# Patient Record
Sex: Female | Born: 1995 | Race: White | Hispanic: No | Marital: Married | Smoking: Never smoker
Health system: Southern US, Community
[De-identification: ages and names within clinical notes are randomized; demographics above are authoritative.]

## PROBLEM LIST (undated history)

## (undated) ENCOUNTER — Inpatient Hospital Stay (HOSPITAL_COMMUNITY): Payer: Self-pay

## (undated) DIAGNOSIS — F419 Anxiety disorder, unspecified: Secondary | ICD-10-CM

## (undated) DIAGNOSIS — F329 Major depressive disorder, single episode, unspecified: Secondary | ICD-10-CM

## (undated) DIAGNOSIS — Z6841 Body Mass Index (BMI) 40.0 and over, adult: Secondary | ICD-10-CM

## (undated) DIAGNOSIS — G43909 Migraine, unspecified, not intractable, without status migrainosus: Secondary | ICD-10-CM

## (undated) DIAGNOSIS — F32A Depression, unspecified: Secondary | ICD-10-CM

## (undated) HISTORY — PX: TONSILLECTOMY: SUR1361

## (undated) HISTORY — DX: Migraine, unspecified, not intractable, without status migrainosus: G43.909

---
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
## 2017-08-13 LAB — OB RESULTS CONSOLE RPR: RPR: NONREACTIVE

## 2017-08-13 LAB — OB RESULTS CONSOLE HEPATITIS B SURFACE ANTIGEN: Hepatitis B Surface Ag: NEGATIVE

## 2017-08-13 LAB — OB RESULTS CONSOLE GC/CHLAMYDIA
Chlamydia: NEGATIVE
Gonorrhea: NEGATIVE

## 2017-08-13 LAB — OB RESULTS CONSOLE ABO/RH: RH Type: POSITIVE

## 2017-08-13 LAB — OB RESULTS CONSOLE HIV ANTIBODY (ROUTINE TESTING): HIV: NONREACTIVE

## 2017-08-13 LAB — OB RESULTS CONSOLE RUBELLA ANTIBODY, IGM: Rubella: NON-IMMUNE/NOT IMMUNE

## 2017-08-13 LAB — OB RESULTS CONSOLE ANTIBODY SCREEN: Antibody Screen: NEGATIVE

## 2017-09-08 NOTE — L&D Delivery Note (Signed)
Delivery Note At 0345 a viable female was delivered via spontaneous vaginal delivery (Presentation: OA ).  APGAR: 7, 9; weight pending.   Placenta status: spontaneously and completely delivered.  Cord: three vessel with the following complications: nuchal x1   Anesthesia:  Epidural Episiotomy:  None Lacerations:  Second degree Suture Repair: 3.0 chromic vicryl rapide Est. Blood Loss (mL):  250  Mom to postpartum.  Baby to Couplet care / Skin to Skin.  Megan Morris 02/19/2018, 5:03 AM

## 2017-12-01 ENCOUNTER — Inpatient Hospital Stay (HOSPITAL_COMMUNITY)
Admission: AD | Admit: 2017-12-01 | Discharge: 2017-12-01 | Disposition: A | Discharge status: Home / Self Care | Payer: 59 | Source: Ambulatory Visit | Attending: Obstetrics and Gynecology | Admitting: Obstetrics and Gynecology

## 2017-12-01 ENCOUNTER — Encounter (HOSPITAL_COMMUNITY): Payer: Self-pay

## 2017-12-01 ENCOUNTER — Inpatient Hospital Stay (HOSPITAL_COMMUNITY): Payer: 59

## 2017-12-01 DIAGNOSIS — Z3A28 28 weeks gestation of pregnancy: Secondary | ICD-10-CM | POA: Diagnosis not present

## 2017-12-01 DIAGNOSIS — O368130 Decreased fetal movements, third trimester, not applicable or unspecified: Secondary | ICD-10-CM | POA: Insufficient documentation

## 2017-12-01 DIAGNOSIS — O368390 Maternal care for abnormalities of the fetal heart rate or rhythm, unspecified trimester, not applicable or unspecified: Secondary | ICD-10-CM

## 2017-12-01 DIAGNOSIS — Z885 Allergy status to narcotic agent status: Secondary | ICD-10-CM | POA: Insufficient documentation

## 2017-12-01 LAB — URINALYSIS, ROUTINE W REFLEX MICROSCOPIC
Bilirubin Urine: NEGATIVE
Glucose, UA: NEGATIVE mg/dL
Hgb urine dipstick: NEGATIVE
Ketones, ur: NEGATIVE mg/dL
Leukocytes, UA: NEGATIVE
Nitrite: NEGATIVE
Protein, ur: NEGATIVE mg/dL
Specific Gravity, Urine: 1.015 (ref 1.005–1.030)
pH: 6 (ref 5.0–8.0)

## 2017-12-01 NOTE — MAU Note (Signed)
Pt presents to MAU with c/o decreased fetal movement for the last few days. Pt denies VB and LOF.

## 2017-12-01 NOTE — MAU Provider Note (Addendum)
  History     CSN: [id number]  Arrival date and time: 12/01/17 1646   None     Chief Complaint  Patient presents with  . Decreased Fetal Movement   HPI Pt seen in the office today by Dr. Drema BalzarineKulwas and reported decreased fetal movement and [facility] and if persistent, report to MAU for eval for NST and BPP.  RN called and reported the pt had arrived and upon trying to do external fetal monitoring noted fetal movement with difficulty tracing the baby.  OB History    Gravida  1   Para      Term      Preterm      AB      Living        SAB      TAB      Ectopic      Multiple      Live Births              History reviewed. No pertinent past medical history.  Past Surgical History:  Procedure Laterality Date  . TONSILLECTOMY      Family History  Problem Relation Age of Onset  . Diabetes Mother   . Fibromyalgia Mother   . Diabetes Father     Social History   Tobacco Use  . Smoking status: Never Smoker  . Smokeless tobacco: Never Used  Substance Use Topics  . Alcohol use: Never    Frequency: Never  . Drug use: Never    Allergies:  Allergies  Allergen Reactions  . Hydrocodone Itching    Medications Prior to Admission  Medication Sig Dispense Refill Last Dose  . Prenatal Vit-Fe Fumarate-FA (PRENATAL MULTIVITAMIN) TABS tablet Take 1 tablet by mouth daily at 12 noon.   11/30/2017 at Unknown time  . promethazine (PHENERGAN) 25 MG tablet Take 25 mg by mouth every 6 (six) hours as needed for nausea or vomiting.   11/30/2017 at Unknown time    Review of Systems  Denies F/C/N/V/D  Physical Exam   Weight 287 lb (130.2 kg). BP in the office 110/58  Physical Exam (per Illene BolusLori Morris, CNM afer 7p sign out) Lungs CTA CV RRR Abd gravid, NT Ext no calf tenderness FHT 130, mod variability, +accel, no decels Toco none  MAU Course  Procedures BPP 8/8 NST CAT 1  Assessment and Plan  P0 at 28 4/7wks with decreased  FM sent to MAU for NST and BPP.  Discharge to home  RTO at regular scheduled appointment  Megan Morris 12/01/2017, 6:24 PM   Illene BolusLori Morris CNM 04/29/18 823pm

## 2017-12-17 ENCOUNTER — Other Ambulatory Visit (HOSPITAL_COMMUNITY): Payer: Self-pay | Admitting: Obstetrics and Gynecology

## 2017-12-17 DIAGNOSIS — O26849 Uterine size-date discrepancy, unspecified trimester: Secondary | ICD-10-CM

## 2018-01-05 ENCOUNTER — Encounter (HOSPITAL_COMMUNITY): Payer: Self-pay

## 2018-01-06 ENCOUNTER — Ambulatory Visit (HOSPITAL_COMMUNITY)
Admission: RE | Admit: 2018-01-06 | Discharge: 2018-01-06 | Disposition: A | Discharge status: Home / Self Care | Payer: 59 | Source: Ambulatory Visit | Attending: Obstetrics and Gynecology | Admitting: Obstetrics and Gynecology

## 2018-01-06 ENCOUNTER — Encounter (HOSPITAL_COMMUNITY): Payer: Self-pay

## 2018-01-06 DIAGNOSIS — O99213 Obesity complicating pregnancy, third trimester: Secondary | ICD-10-CM | POA: Diagnosis present

## 2018-01-06 DIAGNOSIS — O26843 Uterine size-date discrepancy, third trimester: Secondary | ICD-10-CM | POA: Insufficient documentation

## 2018-01-06 DIAGNOSIS — O26849 Uterine size-date discrepancy, unspecified trimester: Secondary | ICD-10-CM

## 2018-01-06 DIAGNOSIS — Z3A33 33 weeks gestation of pregnancy: Secondary | ICD-10-CM | POA: Diagnosis not present

## 2018-01-06 HISTORY — DX: Anxiety disorder, unspecified: F41.9

## 2018-01-06 HISTORY — DX: Morbid (severe) obesity due to excess calories: E66.01

## 2018-01-06 HISTORY — DX: Body Mass Index (BMI) 40.0 and over, adult: Z684

## 2018-01-06 HISTORY — DX: Major depressive disorder, single episode, unspecified: F32.9

## 2018-01-06 HISTORY — DX: Depression, unspecified: F32.A

## 2018-01-06 NOTE — Addendum Note (Signed)
Encounter addended by: Lenoard Aden, RDMS on: 01/06/2018 4:35 PM  Actions taken: Imaging Exam ended

## 2018-01-07 ENCOUNTER — Other Ambulatory Visit (HOSPITAL_COMMUNITY): Payer: Self-pay | Admitting: *Deleted

## 2018-01-07 DIAGNOSIS — O3663X0 Maternal care for excessive fetal growth, third trimester, not applicable or unspecified: Secondary | ICD-10-CM

## 2018-01-15 LAB — OB RESULTS CONSOLE GBS: GBS: NEGATIVE

## 2018-02-03 ENCOUNTER — Encounter (HOSPITAL_COMMUNITY): Payer: Self-pay

## 2018-02-03 ENCOUNTER — Ambulatory Visit (HOSPITAL_COMMUNITY)
Admission: RE | Admit: 2018-02-03 | Discharge: 2018-02-03 | Disposition: A | Discharge status: Home / Self Care | Payer: 59 | Source: Ambulatory Visit | Attending: Obstetrics and Gynecology | Admitting: Obstetrics and Gynecology

## 2018-02-03 ENCOUNTER — Other Ambulatory Visit (HOSPITAL_COMMUNITY): Payer: Self-pay | Admitting: Obstetrics and Gynecology

## 2018-02-03 DIAGNOSIS — O99213 Obesity complicating pregnancy, third trimester: Secondary | ICD-10-CM | POA: Insufficient documentation

## 2018-02-03 DIAGNOSIS — Z3A37 37 weeks gestation of pregnancy: Secondary | ICD-10-CM | POA: Diagnosis not present

## 2018-02-03 DIAGNOSIS — Z362 Encounter for other antenatal screening follow-up: Secondary | ICD-10-CM

## 2018-02-03 DIAGNOSIS — O3663X0 Maternal care for excessive fetal growth, third trimester, not applicable or unspecified: Secondary | ICD-10-CM | POA: Diagnosis not present

## 2018-02-03 DIAGNOSIS — O26843 Uterine size-date discrepancy, third trimester: Secondary | ICD-10-CM

## 2018-02-15 ENCOUNTER — Encounter (HOSPITAL_COMMUNITY): Payer: Self-pay | Admitting: *Deleted

## 2018-02-15 ENCOUNTER — Telehealth (HOSPITAL_COMMUNITY): Payer: Self-pay | Admitting: *Deleted

## 2018-02-15 NOTE — Telephone Encounter (Signed)
Preadmission screen  

## 2018-02-17 ENCOUNTER — Other Ambulatory Visit: Payer: Self-pay | Admitting: Obstetrics & Gynecology

## 2018-02-18 ENCOUNTER — Other Ambulatory Visit: Payer: Self-pay

## 2018-02-18 ENCOUNTER — Inpatient Hospital Stay (HOSPITAL_COMMUNITY): Payer: 59 | Admitting: Anesthesiology

## 2018-02-18 ENCOUNTER — Encounter (HOSPITAL_COMMUNITY): Payer: Self-pay

## 2018-02-18 ENCOUNTER — Encounter (HOSPITAL_COMMUNITY): Payer: Self-pay | Admitting: *Deleted

## 2018-02-18 ENCOUNTER — Inpatient Hospital Stay (HOSPITAL_COMMUNITY)
Admission: AD | Admit: 2018-02-18 | Discharge: 2018-02-21 | DRG: 807 | Disposition: A | Discharge status: Home / Self Care | Payer: 59 | Attending: Obstetrics & Gynecology | Admitting: Obstetrics & Gynecology

## 2018-02-18 DIAGNOSIS — Z3A39 39 weeks gestation of pregnancy: Secondary | ICD-10-CM | POA: Diagnosis not present

## 2018-02-18 DIAGNOSIS — F419 Anxiety disorder, unspecified: Secondary | ICD-10-CM | POA: Diagnosis present

## 2018-02-18 DIAGNOSIS — O3663X0 Maternal care for excessive fetal growth, third trimester, not applicable or unspecified: Secondary | ICD-10-CM | POA: Diagnosis present

## 2018-02-18 DIAGNOSIS — O99214 Obesity complicating childbirth: Principal | ICD-10-CM | POA: Diagnosis present

## 2018-02-18 DIAGNOSIS — F329 Major depressive disorder, single episode, unspecified: Secondary | ICD-10-CM | POA: Diagnosis present

## 2018-02-18 DIAGNOSIS — O99344 Other mental disorders complicating childbirth: Secondary | ICD-10-CM | POA: Diagnosis present

## 2018-02-18 DIAGNOSIS — O99213 Obesity complicating pregnancy, third trimester: Secondary | ICD-10-CM | POA: Diagnosis present

## 2018-02-18 LAB — CBC
HCT: 33.5 % — ABNORMAL LOW (ref 36.0–46.0)
Hemoglobin: 11.1 g/dL — ABNORMAL LOW (ref 12.0–15.0)
MCH: 26.3 pg (ref 26.0–34.0)
MCHC: 33.1 g/dL (ref 30.0–36.0)
MCV: 79.4 fL (ref 78.0–100.0)
Platelets: 179 10*3/uL (ref 150–400)
RBC: 4.22 MIL/uL (ref 3.87–5.11)
RDW: 14.2 % (ref 11.5–15.5)
WBC: 11.1 10*3/uL — ABNORMAL HIGH (ref 4.0–10.5)

## 2018-02-18 LAB — TYPE AND SCREEN
ABO/RH(D): A POS
Antibody Screen: NEGATIVE

## 2018-02-18 LAB — RPR: RPR Ser Ql: NONREACTIVE

## 2018-02-18 LAB — ABO/RH: ABO/RH(D): A POS

## 2018-02-18 MED ORDER — FENTANYL CITRATE (PF) 100 MCG/2ML IJ SOLN: 50.0000 ug | INTRAMUSCULAR | Status: DC | PRN

## 2018-02-18 MED ORDER — SOD CITRATE-CITRIC ACID 500-334 MG/5ML PO SOLN
30.0000 mL | ORAL | Status: DC | PRN
  Administered 2018-02-18: 30 mL via ORAL
  Filled 2018-02-18: qty 15

## 2018-02-18 MED ORDER — PHENYLEPHRINE 40 MCG/ML (10ML) SYRINGE FOR IV PUSH (FOR BLOOD PRESSURE SUPPORT): 80.0000 ug | PREFILLED_SYRINGE | INTRAVENOUS | Status: DC | PRN

## 2018-02-18 MED ORDER — PHENYLEPHRINE 40 MCG/ML (10ML) SYRINGE FOR IV PUSH (FOR BLOOD PRESSURE SUPPORT)
80.0000 ug | PREFILLED_SYRINGE | INTRAVENOUS | Status: DC | PRN
  Filled 2018-02-18: qty 5
  Filled 2018-02-18: qty 10

## 2018-02-18 MED ORDER — LACTATED RINGERS IV SOLN: 500.0000 mL | Freq: Once | INTRAVENOUS | Status: DC

## 2018-02-18 MED ORDER — LIDOCAINE HCL (PF) 1 % IJ SOLN
INTRAMUSCULAR | Status: DC | PRN
  Administered 2018-02-18 (×2): 5 mL via EPIDURAL

## 2018-02-18 MED ORDER — LIDOCAINE HCL (PF) 1 % IJ SOLN: 30.0000 mL | INTRAMUSCULAR | Status: DC | PRN

## 2018-02-18 MED ORDER — OXYTOCIN 40 UNITS IN LACTATED RINGERS INFUSION - SIMPLE MED: 1.0000 m[IU]/min | INTRAVENOUS | Status: DC

## 2018-02-18 MED ORDER — ACETAMINOPHEN 325 MG PO TABS: 650.0000 mg | ORAL_TABLET | ORAL | Status: DC | PRN

## 2018-02-18 MED ORDER — LACTATED RINGERS IV SOLN
500.0000 mL | Freq: Once | INTRAVENOUS | Status: AC
  Administered 2018-02-18: 500 mL via INTRAVENOUS

## 2018-02-18 MED ORDER — LIDOCAINE HCL (PF) 1 % IJ SOLN
30.0000 mL | INTRAMUSCULAR | Status: DC | PRN
  Administered 2018-02-19: 30 mL via SUBCUTANEOUS
  Filled 2018-02-18: qty 30

## 2018-02-18 MED ORDER — OXYTOCIN 40 UNITS IN LACTATED RINGERS INFUSION - SIMPLE MED
1.0000 m[IU]/min | INTRAVENOUS | Status: DC
  Administered 2018-02-18: 2 m[IU]/min via INTRAVENOUS

## 2018-02-18 MED ORDER — FLEET ENEMA 7-19 GM/118ML RE ENEM
1.0000 | ENEMA | RECTAL | Status: DC | PRN
Start: 2018-02-18 — End: 2018-02-20

## 2018-02-18 MED ORDER — TERBUTALINE SULFATE 1 MG/ML IJ SOLN
0.2500 mg | Freq: Once | INTRAMUSCULAR | Status: AC | PRN
  Administered 2018-02-18: 0.25 mg via SUBCUTANEOUS
  Filled 2018-02-18: qty 1

## 2018-02-18 MED ORDER — OXYTOCIN 40 UNITS IN LACTATED RINGERS INFUSION - SIMPLE MED: 2.5000 [IU]/h | INTRAVENOUS | Status: DC

## 2018-02-18 MED ORDER — ONDANSETRON HCL 4 MG/2ML IJ SOLN: 4.0000 mg | Freq: Four times a day (QID) | INTRAMUSCULAR | Status: DC | PRN

## 2018-02-18 MED ORDER — LACTATED RINGERS IV SOLN: 500.0000 mL | INTRAVENOUS | Status: DC | PRN

## 2018-02-18 MED ORDER — EPHEDRINE 5 MG/ML INJ: 10.0000 mg | INTRAVENOUS | Status: DC | PRN

## 2018-02-18 MED ORDER — EPHEDRINE 5 MG/ML INJ
10.0000 mg | INTRAVENOUS | Status: DC | PRN
  Filled 2018-02-18: qty 2

## 2018-02-18 MED ORDER — OXYTOCIN 40 UNITS IN LACTATED RINGERS INFUSION - SIMPLE MED
2.5000 [IU]/h | INTRAVENOUS | Status: DC
  Administered 2018-02-19: 2.5 [IU]/h via INTRAVENOUS

## 2018-02-18 MED ORDER — PHENYLEPHRINE 40 MCG/ML (10ML) SYRINGE FOR IV PUSH (FOR BLOOD PRESSURE SUPPORT)
80.0000 ug | PREFILLED_SYRINGE | INTRAVENOUS | Status: DC | PRN
Start: 2018-02-18 — End: 2018-02-20
  Filled 2018-02-18: qty 5

## 2018-02-18 MED ORDER — ACETAMINOPHEN 325 MG PO TABS
650.0000 mg | ORAL_TABLET | ORAL | Status: DC | PRN
  Administered 2018-02-19: 650 mg via ORAL
  Filled 2018-02-18 (×4): qty 2

## 2018-02-18 MED ORDER — OXYTOCIN 40 UNITS IN LACTATED RINGERS INFUSION - SIMPLE MED
1.0000 m[IU]/min | INTRAVENOUS | Status: DC
  Filled 2018-02-18: qty 1000

## 2018-02-18 MED ORDER — DIPHENHYDRAMINE HCL 50 MG/ML IJ SOLN: 12.5000 mg | INTRAMUSCULAR | Status: DC | PRN

## 2018-02-18 MED ORDER — LACTATED RINGERS IV SOLN
INTRAVENOUS | Status: DC
  Administered 2018-02-19: via INTRAVENOUS

## 2018-02-18 MED ORDER — OXYTOCIN BOLUS FROM INFUSION
500.0000 mL | Freq: Once | INTRAVENOUS | Status: AC
  Administered 2018-02-19: 500 mL via INTRAVENOUS

## 2018-02-18 MED ORDER — OXYTOCIN BOLUS FROM INFUSION: 500.0000 mL | Freq: Once | INTRAVENOUS | Status: DC

## 2018-02-18 MED ORDER — LACTATED RINGERS IV SOLN
INTRAVENOUS | Status: DC
  Administered 2018-02-18 (×3): via INTRAVENOUS

## 2018-02-18 MED ORDER — MISOPROSTOL 25 MCG QUARTER TABLET
25.0000 ug | ORAL_TABLET | ORAL | Status: DC | PRN
  Filled 2018-02-18: qty 1

## 2018-02-18 MED ORDER — LACTATED RINGERS IV SOLN
INTRAVENOUS | Status: DC
  Administered 2018-02-18 – 2018-02-19 (×2): via INTRAUTERINE

## 2018-02-18 MED ORDER — SOD CITRATE-CITRIC ACID 500-334 MG/5ML PO SOLN: 30.0000 mL | ORAL | Status: DC | PRN

## 2018-02-18 MED ORDER — FENTANYL 2.5 MCG/ML BUPIVACAINE 1/10 % EPIDURAL INFUSION (WH - ANES)
14.0000 mL/h | INTRAMUSCULAR | Status: DC | PRN
  Administered 2018-02-18 – 2018-02-19 (×3): 14 mL/h via EPIDURAL
  Filled 2018-02-18 (×3): qty 100

## 2018-02-18 NOTE — Progress Notes (Signed)
Faculty Practice OB/GYN Attending Note  Subjective:  Called to evaluate patient around 2010 with deep recurrent variable FHR decelerations. Upon my arrival, I noted that patient was undergoing re-positioning and other intrauterine neonatal resuscitation maneuvers. Pitocin has been turned off; amnioinfusion ongoing.   Contractions were occurring every 2-3 minutes, FHR with baseline of 130 bpm with deep variable decelerations going down to 60s lasting 1- 1.5 minutes.  Recent cervical exam was 6/90/-1.  I ordered for terbutaline to be given; one dose was administered.  After this and further position changes, the FHR decelerations were noted to resolve to a Category 1 FHR tracing.  Contractions are now every 4 minutes.  No vaginal bleeding. Good FM.  Will continue to monitor closely for now. Hoping for vaginal delivery. I will continue to stand by as needed for emergency issues.  Dr. Mora ApplPinn (covering MD who is currently in OR doing an emergent procedure) is aware of this situation).   Megan CollinsUGONNA  [name], MD, FACOG Obstetrician & Gynecologist, [facility], [facility]

## 2018-02-18 NOTE — Anesthesia Preprocedure Evaluation (Addendum)
Anesthesia Evaluation  Patient identified by MRN, date of birth, ID band Patient awake    Reviewed: Allergy & Precautions, NPO status , Patient's Chart, lab work & pertinent test results  History of Anesthesia Complications Negative for: history of anesthetic complications  Airway Mallampati: II  TM Distance: >3 FB Neck ROM: Full    Dental  (+) Dental Advisory Given   Pulmonary neg pulmonary ROS,    breath sounds clear to auscultation       Cardiovascular negative cardio ROS   Rhythm:Regular Rate:Normal     Neuro/Psych Anxiety Depression negative neurological ROS     GI/Hepatic Neg liver ROS, GERD  ,  Endo/Other  Morbid obesity  Renal/GU negative Renal ROS     Musculoskeletal   Abdominal (+) + obese,   Peds  Hematology plt 179k   Anesthesia Other Findings   Reproductive/Obstetrics (+) Pregnancy                            Anesthesia Physical Anesthesia Plan  ASA: III  Anesthesia Plan: Epidural   Post-op Pain Management:    Induction:   PONV Risk Score and Plan: 2 and Treatment may vary due to age or medical condition  Airway Management Planned: Natural Airway  Additional Equipment:   Intra-op Plan:   Post-operative Plan:   Informed Consent: I have reviewed the patients History and Physical, chart, labs and discussed the procedure including the risks, benefits and alternatives for the proposed anesthesia with the patient or authorized representative who has indicated his/her understanding and acceptance.   Dental advisory given  Plan Discussed with:   Anesthesia Plan Comments: (Patient identified. Risks/Benefits/Options discussed with patient including but not limited to bleeding, infection, nerve damage, paralysis, failed block, incomplete pain control, headache, blood pressure changes, nausea, vomiting, reactions to medication both or allergic, itching and postpartum back  pain. Confirmed with bedside nurse the patient's most recent platelet count. Confirmed with patient that they are not currently taking any anticoagulation, have any bleeding history or any family history of bleeding disorders. Patient expressed understanding and wished to proceed. All questions were answered. )        Anesthesia Quick Evaluation

## 2018-02-18 NOTE — Progress Notes (Signed)
LABOR PROGRESS NOTE  Megan ButcherHarley Morris is a 22 y.o. G1P0 at 1526w6d  admitted for SROM @ 515 am clear fluid.  Subjective: Comfortable with epidural. Occasional variable decel  Objective: BP 108/70   Pulse 83   Temp 98.5 F (36.9 C) (Oral)   Resp 18   Ht 5\' 8"  (1.727 m)   Wt 298 lb (135.2 kg)   LMP 04/23/2017 (Exact Date)   SpO2 98%   BMI 45.31 kg/m  or  Vitals:   02/18/18 1155 02/18/18 1200 02/18/18 1201 02/18/18 1233  BP:   122/70 108/70  Pulse:   85 83  Resp:   18   Temp:      TempSrc:      SpO2: 99% 98%    Weight:      Height:         Dilation: 3 Effacement (%): 90 Cervical Position: Anterior Station: -2 Presentation: Vertex Exam by:: L. Clemmons CNM  Labs: Lab Results  Component Value Date   WBC 11.1 (H) 02/18/2018   HGB 11.1 (L) 02/18/2018   HCT 33.5 (L) 02/18/2018   MCV 79.4 02/18/2018   PLT 179 02/18/2018    Patient Active Problem List   Diagnosis Date Noted  . Normal labor 02/18/2018  . Obesity affecting pregnancy in third trimester 02/18/2018    Assessment / Plan: 22 y.o. G1P0 at 4126w6d here for SROM/augmentation of labor  IUPC placed without difficulty Pitocin on 12 cc/hr FSE in place Fetal Wellbeing:  Cat 1 Pain Control:  Epidural Anticipated MOD:  Increased risk for C/S due to baby's size.  Lori A Clemmons CNM 02/18/2018, 1:01 PM

## 2018-02-18 NOTE — Progress Notes (Signed)
Megan Morris is a 22 y.o. G1P0 at 6953w6d  admitted for SROM @ 515 am clear fluid.  Subjective: Comfortable with epidural. Called to bedside at 1930 as patient was having recurrent variable decels into 60s and 70s. Nurses had repositioned patient right lateral, tried left lateral, and hands and knees before I was urgently called to attend a delivery. Consulted Dr. Mora ApplPinn who was in surgery, requested Dr. Macon LargeAnyanwu at bedside to assess patient. She repositioned patient to high fowlers, discontinued pitocin, and gave dose of terbutaline.   Objective:  Vitals:   02/18/18 1802 02/18/18 1831 02/18/18 1901 02/18/18 1930  BP: (!) 101/58 (!) 105/51 115/68 116/62  Pulse: (!) 119 82 81 81  Resp: 16  18 16   Temp: 98.3 F (36.8 C)     TempSrc: Axillary     SpO2:      Weight:      Height:       Vaginal exam: 8/90/-1/vtx   FHTs: baseline 145, moderate beat to beat variability, +accels, - decels   Labs: Lab Results  Component Value Date   WBC 11.1 (H) 02/18/2018   HGB 11.1 (L) 02/18/2018   HCT 33.5 (L) 02/18/2018   MCV 79.4 02/18/2018   PLT 179 02/18/2018    Patient Active Problem List   Diagnosis Date Noted  . Normal labor 02/18/2018  . Obesity affecting pregnancy in third trimester 02/18/2018    Assessment / Plan: 22 y.o. G1P0 at 4653w6d here for SROM/augmentation of labor  Pitocin off  FSE and IUPC in place Fetal Wellbeing:  Cat II now category I  Pain Control:  Epidural Anticipated MOD:  Anticipate NSVD  Megan Morris CNM 02/18/2018, 8:51 PM

## 2018-02-18 NOTE — H&P (Addendum)
Megan Morris is a 22 y.o. female presenting for induction of labor due to Obesity.Pregnancy complicated by large for gestational age fetus.[place] on 02/11/18 >90% 8-12 oz 3972 grams. , Rubella equivocal  OB History    Gravida  1   Para      Term      Preterm      AB      Living        SAB      TAB      Ectopic      Multiple      Live Births             Past Medical History:  Diagnosis Date  . Anxiety   . Depressive disorder   . Morbid obesity with BMI of 40.0-44.9, adult [name])    Past Surgical History:  Procedure Laterality Date  . TONSILLECTOMY     Family History: family history includes Diabetes in her father and mother; Fibromyalgia in her mother. Social History:  reports that she has never smoked. She has never used smokeless tobacco. She reports that she does not drink alcohol or use drugs. Results for orders placed or performed during the hospital encounter of 02/18/18 (from the past 24 hour(s))  ABO/Rh     Status: None   Collection Time: 02/18/18  7:55 AM  Result Value Ref Range   ABO/RH(D)      A POS Performed at [facility], [address] [place], [place] [address]   CBC     Status: Abnormal   Collection Time: 02/18/18  8:06 AM  Result Value Ref Range   WBC 11.1 (H) 4.0 - 10.5 K/uL   RBC 4.22 3.87 - 5.11 MIL/uL   Hemoglobin 11.1 (L) 12.0 - 15.0 g/dL   HCT 60.4 (L) 54.0 - 98.1 %   MCV 79.4 78.0 - 100.0 fL   MCH 26.3 26.0 - 34.0 pg   MCHC 33.1 30.0 - 36.0 g/dL   RDW 19.1 47.8 - 29.5 %   Platelets 179 150 - 400 K/uL  Type and screen [facility]     Status: None   Collection Time: 02/18/18  8:06 AM  Result Value Ref Range   ABO/RH(D) A POS    Antibody Screen NEG    Sample Expiration      02/21/2018 Performed at [facility], [address], [place], [place] [address]       Maternal Diabetes: No Genetic Screening: Normal Maternal Ultrasounds/Referrals: Abnormal:  Findings:   Other:LGA Fetal Ultrasounds or  other Referrals:  Referred to Materal Fetal Medicine  Maternal Substance Abuse:  No Significant Maternal Medications:  None Significant Maternal Lab Results:  Lab values include: Group B Strep negative Other Comments:  None  Review of Systems  Gastrointestinal: Positive for abdominal pain.  All other systems reviewed and are negative.  Maternal Medical History:  Reason for admission: Rupture of membranes and contractions.   Contractions: Onset was 1-2 hours ago.   Frequency: regular.   Perceived severity is moderate.   Upon arrival  Fetal activity: Perceived fetal activity is normal.   Last perceived fetal movement was within the past hour.    Prenatal complications: LGA OBESITY  Prenatal Complications - Diabetes: none.    Dilation: 3 Effacement (%): 90 Station: -2 Exam by:: L. Jayden Kratochvil CNM Blood pressure 105/62, pulse 90, temperature 98.5 F (36.9 C), temperature source Oral, resp. rate 18, height 5\' 8"  (1.727 m), weight 298 lb (135.2 kg), last menstrual period  04/23/2017, SpO2 98 %. Maternal Exam:  Uterine Assessment: Contraction strength is moderate.  Contraction frequency is regular.   Abdomen: Patient reports no abdominal tenderness. Fundal height is 42.   Estimated fetal weight is 9 pounds.   Fetal presentation: vertex  Introitus: Normal vulva. Normal vagina.  Pelvis: of concern for delivery.   Cervix: Cervix evaluated by digital exam.     Fetal Exam Fetal Monitor Review: Mode: ultrasound.   Pattern: accelerations present and no decelerations.    Fetal State Assessment: Category I - tracings are normal.     Physical Exam  Nursing note and vitals reviewed. Constitutional: She is oriented to person, place, and time. She appears well-developed and well-nourished.  HENT:  Head: Normocephalic and atraumatic.  Cardiovascular: Normal rate and regular rhythm.  Respiratory: Effort normal and breath sounds normal.  GI: Soft. There is no tenderness.   Genitourinary: Vagina normal.  Musculoskeletal: Normal range of motion.  Neurological: She is alert and oriented to person, place, and time.  Skin: Skin is warm and dry.  Psychiatric: She has a normal mood and affect. Her behavior is normal. Thought content normal.    Prenatal labs: ABO, Rh: --/--/A POS (06/13 [id number]) Antibody: NEG (06/13 0806) Rubella: Nonimmune (12/06 0000) RPR: Nonreactive (12/06 0000)  HBsAg: Negative (12/06 0000)  HIV: Non-reactive (12/06 0000)  GBS: Negative (05/10 0000)   Assessment/Plan: Admit to L/D Routine orders Epidural prn Pitocin titration for adequate labor pattern Anticipate vaginal delivery  Theo Reither A Ahsley Attwood CNM 02/18/2018, 1:19 PM

## 2018-02-18 NOTE — MAU Note (Signed)
Pt here with c/o ROM at 0515 for clear fluid. Having contractions as well since then for about every 5 minutes. Denies any bleeding. Good fetal movement.

## 2018-02-18 NOTE — Anesthesia Pain Management Evaluation Note (Signed)
  CRNA Pain Management Visit Note  Patient: Megan Morris, 22 y.o., female  "Hello [facility]. We have an anesthesia team available at all times to provide care throughout the hospital, including epidural management and anesthesia for C-section. I don't know your plan for the delivery whether it a natural birth, water birth, IV sedation, nitrous supplementation, doula or epidural, but we want to meet your pain goals."   1.Was your pain managed to your expectations on prior hospitalizations?   No prior hospitalizations  2.What is your expectation for pain management during this hospitalization?     Epidural  3.How can we help you reach that goal? Maintain comfort with epidural.  Record the patient's initial score and the patient's pain goal.   Pain: 0  Pain Goal: 3 [facility] wants you to be able to say your pain was always managed very well.  Megan Morris 02/18/2018

## 2018-02-18 NOTE — Anesthesia Procedure Notes (Signed)
Epidural Patient location during procedure: OB Start time: 02/18/2018 10:54 AM End time: 02/18/2018 11:14 AM  Staffing Anesthesiologist: Jairo BenJackson, Helyn Schwan, MD Performed: anesthesiologist   Preanesthetic Checklist Completed: patient identified, surgical consent, pre-op evaluation, timeout performed, IV checked, risks and benefits discussed and monitors and equipment checked  Epidural Patient position: sitting Prep: site prepped and draped and DuraPrep Patient monitoring: blood pressure, continuous pulse ox and heart rate Approach: midline Location: L2-L3 Injection technique: LOR air  Needle:  Needle type: Tuohy  Needle gauge: 17 G Needle length: 9 cm Needle insertion depth: 7 cm Catheter type: closed end flexible Catheter size: 19 Gauge Catheter at skin depth: 13 cm Test dose: negative (1% lidocaine)  Assessment Events: blood not aspirated, injection not painful, no injection resistance, negative IV test and no paresthesia  Additional Notes Pt identified in Labor room.  Monitors applied. Working IV access confirmed. Sterile prep, drape lumbar spine.  1% lido local L 2,3.  #17ga Touhy LOR air at 7 cm L 2,3, cath in easily to 13 cm skin. Test dose OK, cath dosed and infusion begun.  Patient asymptomatic, VSS, no heme aspirated, tolerated well.  Megan Morris, MDReason for block:procedure for pain

## 2018-02-19 ENCOUNTER — Encounter (HOSPITAL_COMMUNITY): Payer: Self-pay | Admitting: *Deleted

## 2018-02-19 ENCOUNTER — Inpatient Hospital Stay (HOSPITAL_COMMUNITY)
Admission: RE | Admit: 2018-02-19 | Discharge: 2018-02-19 | Disposition: A | Discharge status: Home / Self Care | Payer: 59 | Source: Ambulatory Visit | Attending: Obstetrics & Gynecology | Admitting: Obstetrics & Gynecology

## 2018-02-19 LAB — CBC
HCT: 31.6 % — ABNORMAL LOW (ref 36.0–46.0)
Hemoglobin: 10.6 g/dL — ABNORMAL LOW (ref 12.0–15.0)
MCH: 26.5 pg (ref 26.0–34.0)
MCHC: 33.5 g/dL (ref 30.0–36.0)
MCV: 79 fL (ref 78.0–100.0)
Platelets: 167 10*3/uL (ref 150–400)
RBC: 4 MIL/uL (ref 3.87–5.11)
RDW: 14.3 % (ref 11.5–15.5)
WBC: 20.5 10*3/uL — ABNORMAL HIGH (ref 4.0–10.5)

## 2018-02-19 MED ORDER — OXYTOCIN 40 UNITS IN LACTATED RINGERS INFUSION - SIMPLE MED: 1.0000 m[IU]/min | INTRAVENOUS | Status: DC

## 2018-02-19 MED ORDER — SENNOSIDES-DOCUSATE SODIUM 8.6-50 MG PO TABS
2.0000 | ORAL_TABLET | ORAL | Status: DC
  Administered 2018-02-19 – 2018-02-21 (×2): 2 via ORAL
  Filled 2018-02-19 (×2): qty 2

## 2018-02-19 MED ORDER — ACETAMINOPHEN 325 MG PO TABS
650.0000 mg | ORAL_TABLET | ORAL | Status: DC | PRN
  Administered 2018-02-19 – 2018-02-21 (×6): 650 mg via ORAL
  Filled 2018-02-19 (×3): qty 2

## 2018-02-19 MED ORDER — ONDANSETRON HCL 4 MG PO TABS: 4.0000 mg | ORAL_TABLET | ORAL | Status: DC | PRN

## 2018-02-19 MED ORDER — TETANUS-DIPHTH-ACELL PERTUSSIS 5-2.5-18.5 LF-MCG/0.5 IM SUSP
0.5000 mL | Freq: Once | INTRAMUSCULAR | Status: DC
  Filled 2018-02-19: qty 0.5

## 2018-02-19 MED ORDER — WITCH HAZEL-GLYCERIN EX PADS: 1.0000 "application " | MEDICATED_PAD | CUTANEOUS | Status: DC | PRN

## 2018-02-19 MED ORDER — PRENATAL MULTIVITAMIN CH
1.0000 | ORAL_TABLET | Freq: Every day | ORAL | Status: DC
  Filled 2018-02-19 (×2): qty 1

## 2018-02-19 MED ORDER — ONDANSETRON HCL 4 MG/2ML IJ SOLN: 4.0000 mg | INTRAMUSCULAR | Status: DC | PRN

## 2018-02-19 MED ORDER — SIMETHICONE 80 MG PO CHEW: 80.0000 mg | CHEWABLE_TABLET | ORAL | Status: DC | PRN

## 2018-02-19 MED ORDER — COCONUT OIL OIL: 1.0000 "application " | TOPICAL_OIL | Status: DC | PRN

## 2018-02-19 MED ORDER — ZOLPIDEM TARTRATE 5 MG PO TABS: 5.0000 mg | ORAL_TABLET | Freq: Every evening | ORAL | Status: DC | PRN

## 2018-02-19 MED ORDER — MEASLES, MUMPS & RUBELLA VAC ~~LOC~~ INJ
0.5000 mL | INJECTION | Freq: Once | SUBCUTANEOUS | Status: AC
  Administered 2018-02-21: 0.5 mL via SUBCUTANEOUS
  Filled 2018-02-19 (×2): qty 0.5

## 2018-02-19 MED ORDER — DIPHENHYDRAMINE HCL 25 MG PO CAPS: 25.0000 mg | ORAL_CAPSULE | Freq: Four times a day (QID) | ORAL | Status: DC | PRN

## 2018-02-19 MED ORDER — BENZOCAINE-MENTHOL 20-0.5 % EX AERO
1.0000 | INHALATION_SPRAY | CUTANEOUS | Status: DC | PRN
Start: 2018-02-19 — End: 2018-02-21
  Administered 2018-02-19 – 2018-02-21 (×2): 1 via TOPICAL
  Filled 2018-02-19 (×2): qty 56

## 2018-02-19 MED ORDER — IBUPROFEN 600 MG PO TABS
600.0000 mg | ORAL_TABLET | Freq: Four times a day (QID) | ORAL | Status: DC
  Administered 2018-02-19 – 2018-02-21 (×10): 600 mg via ORAL
  Filled 2018-02-19 (×10): qty 1

## 2018-02-19 MED ORDER — DIBUCAINE 1 % RE OINT: 1.0000 "application " | TOPICAL_OINTMENT | RECTAL | Status: DC | PRN

## 2018-02-19 NOTE — Anesthesia Postprocedure Evaluation (Signed)
Anesthesia Post Note  Patient: Megan Morris  Procedure(s) Performed: AN AD HOC LABOR EPIDURAL     Patient location during evaluation: Mother Baby Anesthesia Type: Epidural Level of consciousness: awake and alert, oriented and patient cooperative Pain management: pain level controlled Vital Signs Assessment: post-procedure vital signs reviewed and stable Respiratory status: spontaneous breathing Cardiovascular status: stable Postop Assessment: no headache, epidural receding, patient able to bend at knees and no signs of nausea or vomiting Anesthetic complications: no Comments: Pain score 0.    Last Vitals:  Vitals:   02/19/18 0545 02/19/18 0645  BP: 123/67 125/73  Pulse: (!) 120 (!) 119  Resp: 18 18  Temp: 36.8 C 37.2 C  SpO2: 99% 97%    Last Pain:  Vitals:   02/19/18 0652  TempSrc:   PainSc: 0-No pain   Pain Goal: Patients Stated Pain Goal: 5 (02/19/18 0200)               Merrilyn PumaWRINKLE,Jerimey Burridge

## 2018-02-19 NOTE — Lactation Note (Signed)
This note was copied from a baby's chart. Lactation Consultation Note  Patient Name: Megan Morris ZOXWR'UToday's Date: 02/19/2018 Reason for consult: Initial assessment;1st time breastfeeding;Other (Comment);Term(no breast changes with pregnancy)   Initial consult with mom of 7 hour old infant. Infant with 1 BF attempt, 1 void and 1 stool since birth. Mom reports infant will latch and then fall asleep. Infant asleep on moms chest. Enc STS.   Enc mom to offer breast STS 8-12 x in 24 hours at first feeding cues. Enc mom to place infant STS every few hours to encourage feeding when sleepy. Reviewed normal newborn feeding behaviors. Mom reports he is able to hand express, she did not see colostrum, enc her to hand express with each feeding. Mom to offer EBM in spoon as available. Mom reports RN has assisted with feeding and postioning.   BF Resources handout and LC Brochure given, mom informed of IP/OP services, BF Support Groups and LC phone #. Mom to call out for feeding assistance as needed.    Maternal Data Formula Feeding for Exclusion: No Has patient been taught Hand Expression?: Yes Does the patient have breastfeeding experience prior to this delivery?: No  Feeding Feeding Type: Breast Fed Length of feed: (few sucks)  LATCH Score Latch: Too sleepy or reluctant, no latch achieved, no sucking elicited.  Audible Swallowing: None  Type of Nipple: Flat(short)  Comfort (Breast/Nipple): Soft / non-tender  Hold (Positioning): Assistance needed to correctly position infant at breast and maintain latch.  LATCH Score: 4  Interventions Interventions: Breast feeding basics reviewed;Support pillows;Skin to skin;Breast compression;Hand express;Breast massage  Lactation Tools Discussed/Used WIC Program: Yes   Consult Status Consult Status: Follow-up Date: 02/20/18 Follow-up type: In-patient    Silas FloodSharon S Hice 02/19/2018, 11:32 AM

## 2018-02-19 NOTE — Progress Notes (Signed)
Megan ButcherHarley Morris is a 22 y.o. G1P0 at 4459w6d  admitted for SROM @ 515 am clear fluid.  Subjective: Comfortable with epidural, able to rest.   Objective:  Vitals:   02/19/18 0000 02/19/18 0030 02/19/18 0100 02/19/18 0130  BP: 114/68 121/74 121/80 127/74  Pulse: (!) 105 96 (!) 108 100  Resp: 14 18 16 14   Temp:   99 F (37.2 C)   TempSrc:   Oral   SpO2:      Weight:      Height:       Vaginal exam: 9.5/100/-1  UCs q2-823min, regular, MVUs 130  FHTs: baseline 145, moderate beat to beat variability, +accels, - decels   Labs: Lab Results  Component Value Date   WBC 11.1 (H) 02/18/2018   HGB 11.1 (L) 02/18/2018   HCT 33.5 (L) 02/18/2018   MCV 79.4 02/18/2018   PLT 179 02/18/2018    Patient Active Problem List   Diagnosis Date Noted  . Normal labor 02/18/2018  . Obesity affecting pregnancy in third trimester 02/18/2018    Assessment / Plan: 22 y.o. G1P0 at 6259w6d here for SROM/augmentation of labor Modify pitocin per Dr. Philippa SicksPin, 2x2 Peanut ball   Fetal Wellbeing:  Cat I Pain Control:  Epidural Anticipated MOD:  Anticipate NSVD, discussed baby's size impacting dilation and descent into the pelvis   Janeece RiggersEllis K Jeff Frieden CNM 02/19/2018, 1:34 AM

## 2018-02-20 ENCOUNTER — Encounter (HOSPITAL_COMMUNITY): Payer: Self-pay | Admitting: *Deleted

## 2018-02-20 NOTE — Progress Notes (Signed)
Mother of baby was referred for history of depression and anxiety. Referral screened out by CSW because per chart review, patient was on Lexapro and Buspar prior to pregnancy, ceased at the beginning but resumed in March 2019.    Please contact CSW if mother of baby requests, if needs arise, or if mother of baby scores greater than a nine or answers yes to question ten on [place] Postpartum Depression Screen.  Edwin Dadaarol Suprena Travaglini, MSW, LCSW-A Clinical Social Worker [facility] [phone]

## 2018-02-20 NOTE — Progress Notes (Signed)
Post Partum Day 1  Subjective: no complaints, up ad lib, voiding and tolerating PO  Objective: Blood pressure 104/79, pulse 96, temperature 97.9 F (36.6 C), temperature source Oral, resp. rate 18, height 5\' 8"  (1.727 m), weight 135.2 kg (298 lb), last menstrual period 04/23/2017, SpO2 97 %, unknown if currently breastfeeding.  Physical Exam:  General: alert and cooperative Lochia: appropriate Uterine Fundus: firm Incision: n/a DVT Evaluation: No evidence of DVT seen on physical exam. Negative Homan's sign. No cords or calf tenderness. No significant calf/ankle edema. Vitals:   02/19/18 1846 02/20/18 0638  BP: 114/61 104/79  Pulse: 90 96  Resp: 16 18  Temp: 98.3 F (36.8 C) 97.9 F (36.6 C)  SpO2:     Results for orders placed or performed during the hospital encounter of 02/18/18 (from the past 48 hour(s))  CBC     Status: Abnormal   Collection Time: 02/19/18  7:05 AM  Result Value Ref Range   WBC 20.5 (H) 4.0 - 10.5 K/uL   RBC 4.00 3.87 - 5.11 MIL/uL   Hemoglobin 10.6 (L) 12.0 - 15.0 g/dL   HCT 40.931.6 (L) 81.136.0 - 91.446.0 %   MCV 79.0 78.0 - 100.0 fL   MCH 26.5 26.0 - 34.0 pg   MCHC 33.5 30.0 - 36.0 g/dL   RDW 78.214.3 95.611.5 - 21.315.5 %   Platelets 167 150 - 400 K/uL    Comment: Performed at [facility], [address] [place], [place] [address]    Assessment/Plan: Plan for discharge tomorrow and Breastfeeding, outpatient circumcision    LOS: 2 days   Janeece Riggersllis K Jaella Weinert 02/20/2018, 9:19 AM

## 2018-02-20 NOTE — Lactation Note (Signed)
This note was copied from a baby's chart. Lactation Consultation Note Baby is 5021 hrs old. Mom upset crying because baby will not latch and she thinks he's hungry and needs formula. Mom states that the longest she stayed on her breast was with a NS and then it kept coming off.  Mom has pendulous breast w/small semi flat nipple at the bottom end of the breast. Hand expressed colostrum easily. Praised mom. Collected 3 ml. Encouraged to pump to supplement baby w/colostrum. Baby starting to cry. Fitting mom w/her #16 NS. Taught application and "C" hold. W/props boppy and pillow to position baby in football position.  Baby has significant recessed chin and bottom lip folds into mouth. Taught chin tug. W/gloved finger assessed mouth. Baby has a very high palate, labial frenulum. Unable to assess tongue, baby wouldn't raise or move it. LC suspects probably has tight frenulum by the decreased mobility of the tongue while suckling on gloved finger.   Baby latched w/several tries, held NS at breast for several minutes. LC inserted colostrum into NS and on NS. Baby started suckling. Inserted colostrum w/curve tip syring. Baby fed well. Mom has hand pump, encouraged to pump and sive colostrum as supplement.  Mom wanting the baby to have formula even though mom had colostrum. 5 ml Gerber formula give w/curve tip syring. Reviewed amount baby soul have as supplementing. RN reviewed LEAD. Baby had increased respirations, had been crying prior to breast. Un-swaddled from blanket before placing to breast. Baby felt warm. Respirations decreased as baby fed. When feeding finished respirations almost normal.  Laid mom back in bed holding baby STS. Encouraged mom and FOB to call RN if notice respirations increasing or any change.  Encouraged mom to wake baby and un-swaddle for feedings. Discussed newborn feeding habits. Reported to RN.   Patient Name: Megan Morris GEXBM'WToday's Date: 02/20/2018 Reason for consult:  Follow-up assessment;Mother's request;Difficult latch   Maternal Data    Feeding Feeding Type: Formula Length of feed: 10 min  LATCH Score Latch: Repeated attempts needed to sustain latch, nipple held in mouth throughout feeding, stimulation needed to elicit sucking reflex.  Audible Swallowing: Spontaneous and intermittent  Type of Nipple: Flat  Comfort (Breast/Nipple): Soft / non-tender  Hold (Positioning): Full assist, staff holds infant at breast  LATCH Score: 6  Interventions Interventions: Breast feeding basics reviewed;Support pillows;Assisted with latch;Position options;Skin to skin;Expressed milk;Breast massage;Hand express;Pre-pump if needed;Hand pump;Breast compression;Adjust position  Lactation Tools Discussed/Used Tools: Pump;Nipple Shields Nipple shield size: 16 Breast pump type: Manual   Consult Status Consult Status: Follow-up Date: 02/20/18 Follow-up type: In-patient    [name], Diamond NickelLAURA G 02/20/2018, 1:08 AM

## 2018-02-21 ENCOUNTER — Inpatient Hospital Stay (HOSPITAL_COMMUNITY): Admission: RE | Admit: 2018-02-21 | Payer: 59 | Source: Ambulatory Visit

## 2018-02-21 MED ORDER — IBUPROFEN 600 MG PO TABS: 600.0000 mg | ORAL_TABLET | Freq: Four times a day (QID) | ORAL | 0 refills | Status: DC

## 2018-02-21 MED ORDER — ESCITALOPRAM OXALATE 10 MG PO TABS
10.0000 mg | ORAL_TABLET | Freq: Every day | ORAL | 0 refills | Status: DC
Start: 2018-02-21 — End: 2020-03-01

## 2018-02-21 NOTE — Discharge Summary (Signed)
SVD OB Discharge Summary     Patient Name: Megan Morris DOB: Oct 25, 1995 MRN: [id number]  Date of admission: 02/18/2018 Delivering MD: Kathalene Frames K  Date of delivery: 02/19/2018  Type of delivery: SVD  Newborn Data: Sex: BB Circumcision: Out-pt Live born female  Birth Weight: 9 lb 9.1 oz (4340 g) APGAR: 7, 9  Newborn Delivery   Birth date/time:  02/19/2018 03:45:00 Delivery type:  Vaginal, Spontaneous     Feeding: breast and bottle Infant being discharge to home with mother in stable condition.   Admitting diagnosis: 40 WEEKS ROM Intrauterine pregnancy: [redacted]w[redacted]d     Secondary diagnosis:  Active Problems:   Normal labor   Obesity affecting pregnancy in third trimester LGA Anxiety/depression                               Complications: None                                                              Intrapartum Procedures: spontaneous vaginal delivery Postpartum Procedures: none Complications-Operative and Postpartum: 2 degree degree perineal laceration Augmentation: Pitocin   History of Present Illness: Ms. Megan Morris is a 22 y.o. female, G1P1001, who presents at [redacted]w[redacted]d weeks gestation. The patient has been followed at  [facility]  Her pregnancy has been complicated by:  @PROB   Hospital course:  Induction of Labor With Vaginal Delivery   22 y.o. yo G1P1001 at [redacted]w[redacted]d was admitted to the hospital 02/18/2018 for induction of labor.  Indication for induction: obesity and LGA.  Patient had an uncomplicated labor course as follows: Membrane Rupture Time/Date: 5:15 AM ,02/18/2018   Intrapartum Procedures: Episiotomy: None [1]                                         Lacerations:  2nd degree [3]  Patient had delivery of a Viable infant.  Information for the patient's newborn:  Megan, Morris [[id number]]  Delivery Method: Vag-Spont   02/19/2018  Pt was found in chair with husband at bedside and infant in bassinet. Pt is in  stable condition, in NAD, breast/botttle feeding. Pt passing gas, urinating without difficulty, and eating well. Pt tolerated ambulation, takes motrin and tylenol for pain control. Pt endorses feeling in good spirits, but would like to restart her lexapro that she was on previous to pregnancy. Pt had BB with outpt cir being performed, pt ready for d/c. Pt will get nexplanon at 6 week PPV. Details of delivery can be found in separate delivery note.  Patient had a routine postpartum course. Patient is discharged home 02/21/18.    Physical exam  Vitals:   02/20/18 0638 02/20/18 1419 02/20/18 2210 02/21/18 0512  BP: 104/79 117/86 129/82 123/75  Pulse: 96 90 83 72  Resp: 18 18 16 16   Temp: 97.9 F (36.6 C) 98.3 F (36.8 C) 98.6 F (37 C) 98.1 F (36.7 C)  TempSrc: Oral Oral Oral Oral  SpO2:   99% 98%  Weight:      Height:       General: alert, cooperative and no distress Lochia: appropriate Uterine  Fundus: firm Perineum: 2nd degree degree laceration, no erythema, no drainage, healing well. DVT Evaluation: No evidence of DVT seen on physical exam. Negative Homan's sign. No cords or calf tenderness. No significant calf/ankle edema.  Labs: Lab Results  Component Value Date   WBC 20.5 (H) 02/19/2018   HGB 10.6 (L) 02/19/2018   HCT 31.6 (L) 02/19/2018   MCV 79.0 02/19/2018   PLT 167 02/19/2018   No flowsheet data found.  Date of discharge: 02/21/2018 Discharge Diagnoses: Term Pregnancy-delivered Discharge instruction: per After Visit Summary and "Baby and Me Booklet".  After visit meds:  Allergies as of 02/21/2018      Reactions   Hydrocodone Itching      Medication List    STOP taking these medications   promethazine 25 MG tablet Commonly known as:  PHENERGAN     TAKE these medications   acetaminophen 500 MG tablet Commonly known as:  TYLENOL Take 500 mg by mouth every 6 (six) hours as needed for moderate pain.   escitalopram 10 MG tablet Commonly known as:   LEXAPRO Take 1 tablet (10 mg total) by mouth daily. Take 5mg  or half of a pill daily for 1 week then increase to whole pill after one week   ibuprofen 600 MG tablet Commonly known as:  ADVIL,MOTRIN Take 1 tablet (600 mg total) by mouth every 6 (six) hours.   prenatal multivitamin Tabs tablet Take 1 tablet by mouth daily at 12 noon.       Activity:           unrestricted Advance as tolerated. Pelvic rest for 6 weeks.  Diet:                routine Medications: PNV, Ibuprofen and tylenol and lexapro for depression and anxiety started at 5mg  daily.  Postpartum contraception: Nexplanon Condition:  Pt discharge to home with baby in stable Depression: 1 week f/u with [facility] set appointment for same time as BBs circ.   Meds: Allergies as of 02/21/2018      Reactions   Hydrocodone Itching      Medication List    STOP taking these medications   promethazine 25 MG tablet Commonly known as:  PHENERGAN     TAKE these medications   acetaminophen 500 MG tablet Commonly known as:  TYLENOL Take 500 mg by mouth every 6 (six) hours as needed for moderate pain.   escitalopram 10 MG tablet Commonly known as:  LEXAPRO Take 1 tablet (10 mg total) by mouth daily. Take 5mg  or half of a pill daily for 1 week then increase to whole pill after one week   ibuprofen 600 MG tablet Commonly known as:  ADVIL,MOTRIN Take 1 tablet (600 mg total) by mouth every 6 (six) hours.   prenatal multivitamin Tabs tablet Take 1 tablet by mouth daily at 12 noon.       Discharge Follow Up:  Follow-up Information    [facility] week(s).   Specialty:  Obstetrics and Gynecology Why:  1 week f/u for PP depression and meds check, then 6 week PPV with nexplanon placement.  Contact information: [address] [place] [phone]           AllendaleJade Aydden Cumpian, NP-C, CNM 02/21/2018, 12:32 PM  Dale DurhamMONTANA, Megan Tenny, FNP

## 2018-02-21 NOTE — Plan of Care (Signed)
Patient is alert and oriented x4. Denies any pain or discomfort at this time. Patient has ambulated in her room without any difficulty. Fundus is firm and scant amount of bleeding noted on Peri-pad.

## 2018-02-21 NOTE — Progress Notes (Signed)
All discharge teaching completed with the patient. All printed discharge instructions given and explained to the patient. Patient verbalized an understanding of all instructions given and denies any questions or concerns.

## 2018-04-19 ENCOUNTER — Telehealth: Payer: Self-pay | Admitting: *Deleted

## 2018-04-19 ENCOUNTER — Ambulatory Visit: Payer: 59 | Admitting: Neurology

## 2018-04-19 ENCOUNTER — Encounter: Payer: Self-pay | Admitting: Neurology

## 2018-04-19 NOTE — Telephone Encounter (Signed)
No showed new patient appointment. 

## 2018-04-23 ENCOUNTER — Other Ambulatory Visit: Payer: Self-pay

## 2018-04-23 ENCOUNTER — Inpatient Hospital Stay (HOSPITAL_COMMUNITY)
Admission: AD | Admit: 2018-04-23 | Discharge: 2018-04-23 | Disposition: A | Discharge status: Home / Self Care | Payer: 59 | Source: Ambulatory Visit | Attending: Obstetrics & Gynecology | Admitting: Obstetrics & Gynecology

## 2018-04-23 ENCOUNTER — Encounter (HOSPITAL_COMMUNITY): Payer: Self-pay

## 2018-04-23 DIAGNOSIS — N924 Excessive bleeding in the premenopausal period: Secondary | ICD-10-CM | POA: Diagnosis not present

## 2018-04-23 DIAGNOSIS — Z3202 Encounter for pregnancy test, result negative: Secondary | ICD-10-CM | POA: Diagnosis not present

## 2018-04-23 LAB — CBC
HCT: 33.9 % — ABNORMAL LOW (ref 36.0–46.0)
Hemoglobin: 11.6 g/dL — ABNORMAL LOW (ref 12.0–15.0)
MCH: 28.2 pg (ref 26.0–34.0)
MCHC: 34.2 g/dL (ref 30.0–36.0)
MCV: 82.5 fL (ref 78.0–100.0)
Platelets: 234 10*3/uL (ref 150–400)
RBC: 4.11 MIL/uL (ref 3.87–5.11)
RDW: 15.9 % — ABNORMAL HIGH (ref 11.5–15.5)
WBC: 9.8 10*3/uL (ref 4.0–10.5)

## 2018-04-23 LAB — POCT PREGNANCY, URINE: Preg Test, Ur: NEGATIVE

## 2018-04-23 NOTE — Progress Notes (Signed)
Requested by [place], CNM to enter discharge order and impression d/t provider in the OR. Order and impression entered. See Clemmons MAU provider note for details.

## 2018-04-23 NOTE — MAU Note (Signed)
Pt presents today with vaginal bldg.  Vaginal delivery on 02/19/18. LMP-04/15/18. Cycle started out as spotting; but had an increase in bldg on 04/19/18. Pt states,"changing one pad/hour since 04/19/18; completely saturating. Lower abd pain 6/10. Started OCP's x "6wks after delivery (questionable name of pills"). Last IC-04/10/18

## 2018-04-23 NOTE — MAU Note (Signed)
Urine in lab 

## 2019-06-12 IMAGING — US US MFM FETAL BPP W/O NON-STRESS
1 series · 12 of 12 positions shown · non-contrast
Comparison: none

[Series 1: us mfm fetal bpp w/o non-stress · 12 acquisitions, 12 frames shown]
[im 1/12]
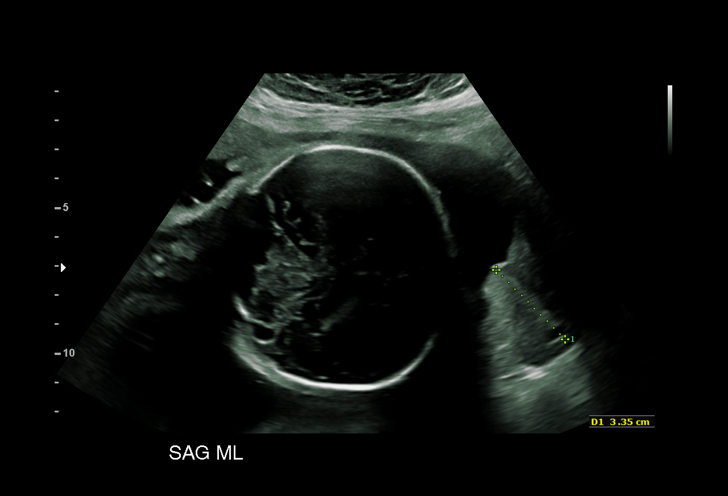
[im 2/12]
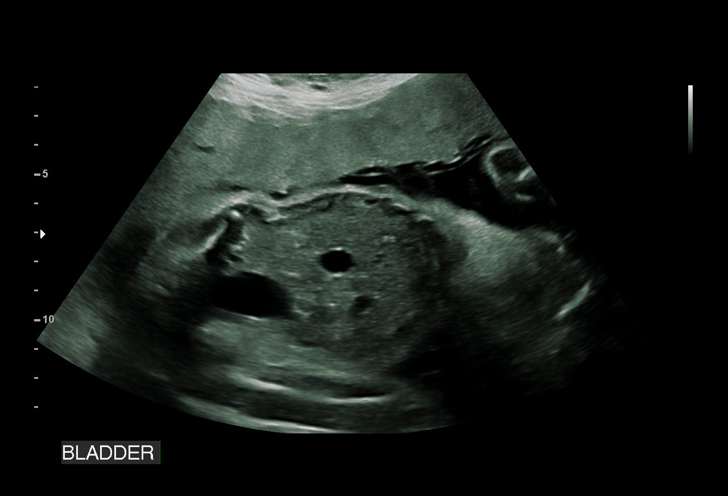
[im 3/12]
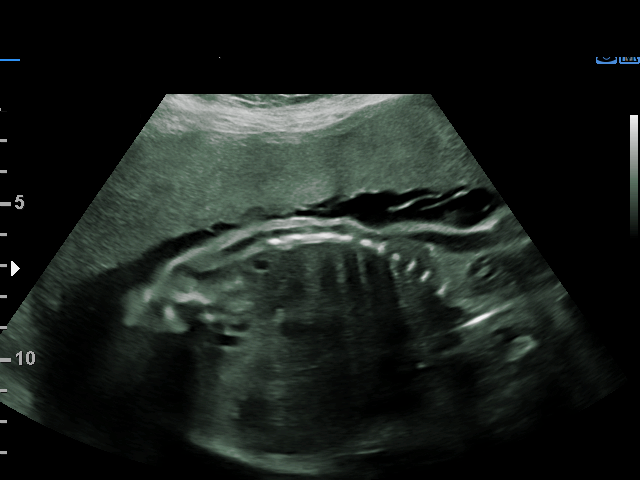
[im 4/12]
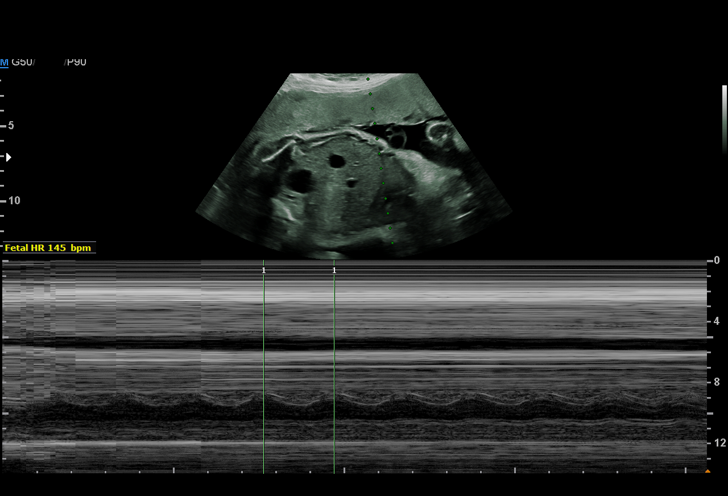
[im 5/12]
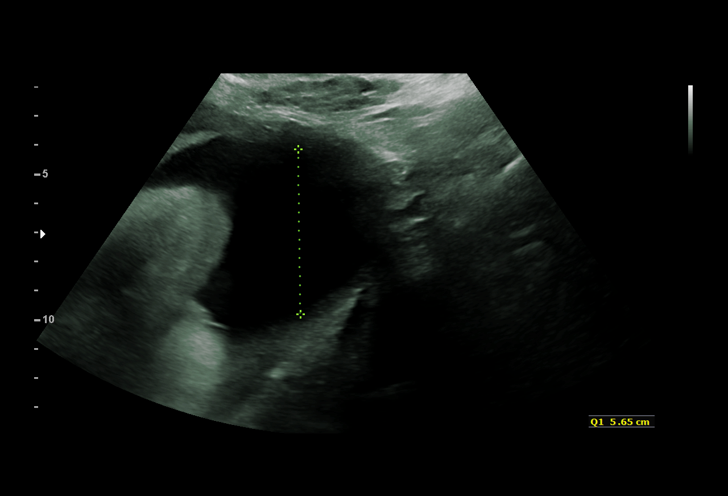
[im 6/12]
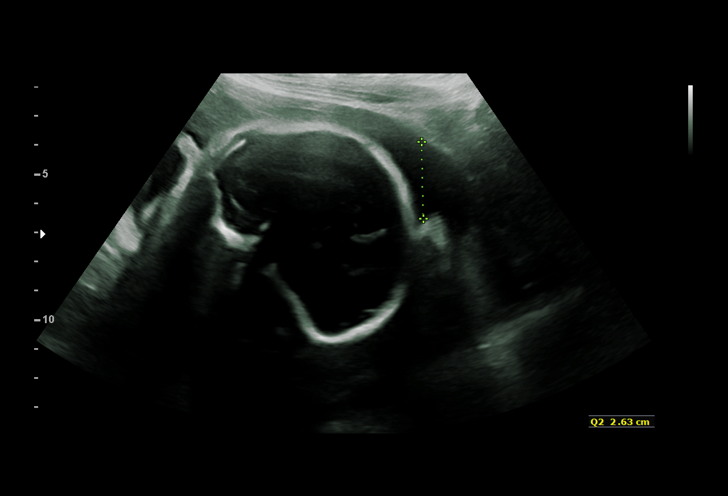
[im 7/12]
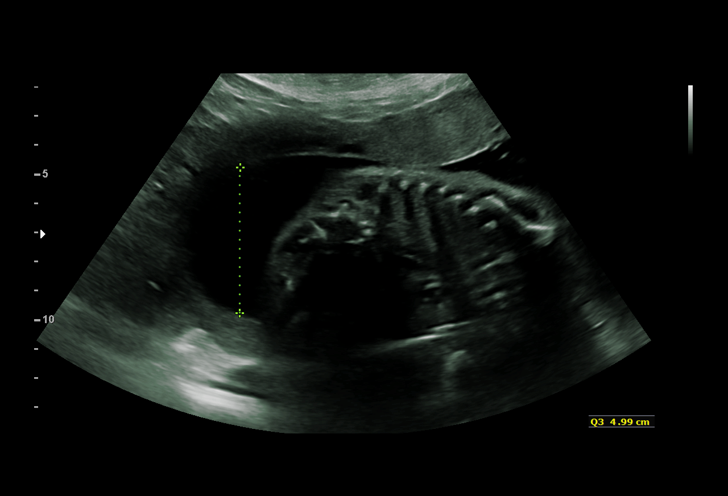
[im 8/12]
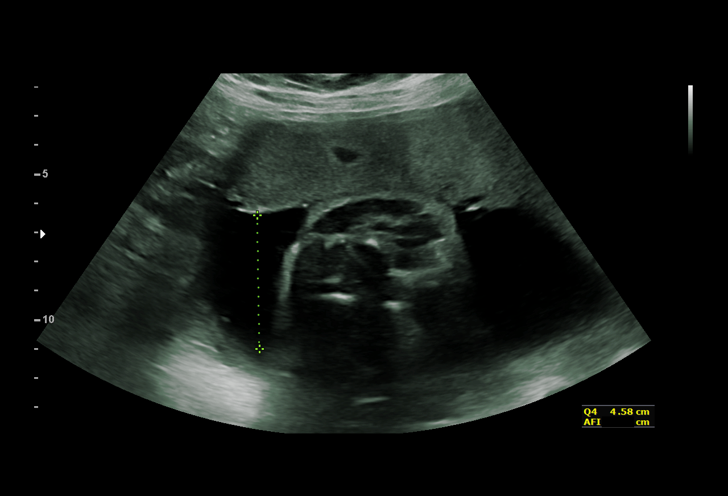
[im 9/12]
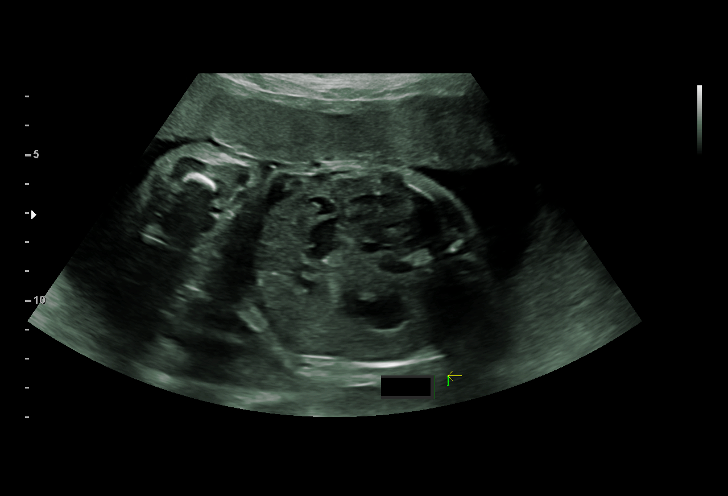
[im 10/12]
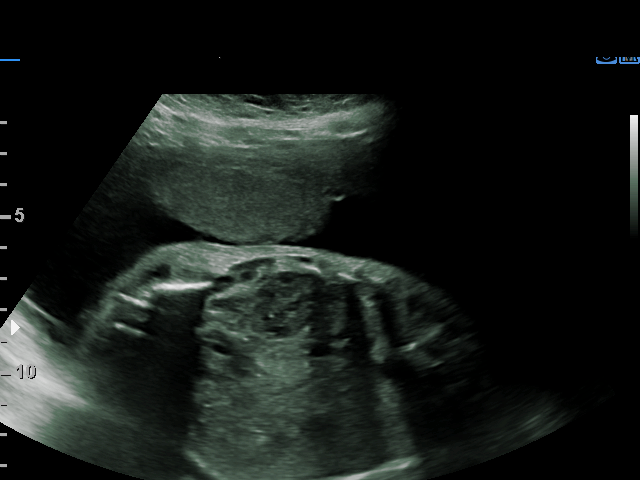
[im 11/12]
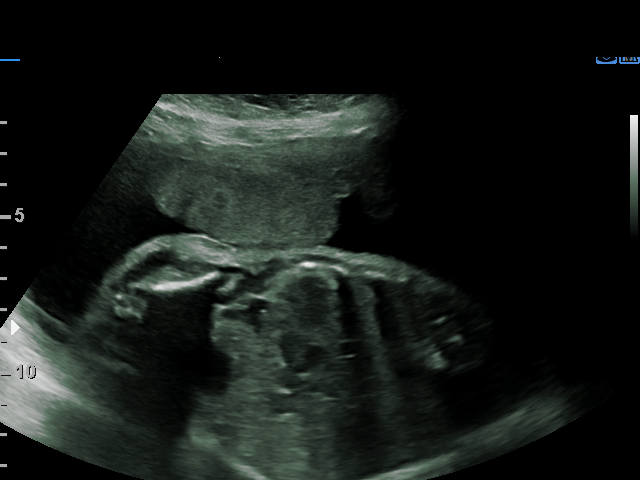
[im 12/12]
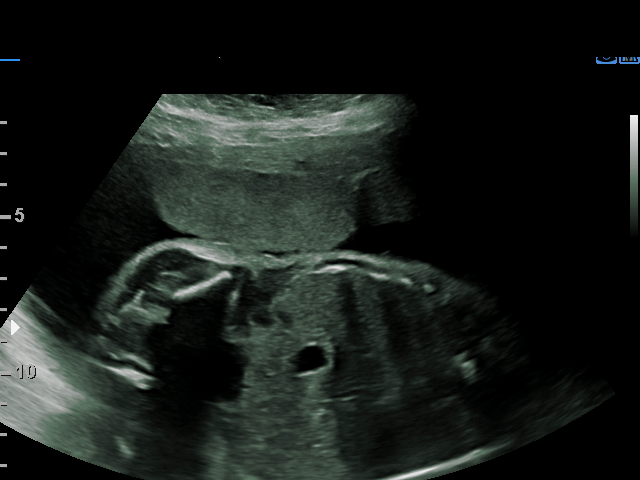

[12 of 12 positions shown; findings below may reference images not displayed]

1  WHIT OXENDINE           [id number]      [id number]     [id number]
Indications

28 weeks gestation of pregnancy
Decreased fetal movement
Maternal morbid obesity
OB History

Gravidity:    1
Fetal Evaluation

Num Of Fetuses:     1
Fetal Heart         145
Rate(bpm):
Cardiac Activity:   Observed
Presentation:       Cephalic

Amniotic Fluid
AFI FV:      Subjectively within normal limits

AFI Sum(cm)     %Tile       Largest Pocket(cm)
17.85           68

RUQ(cm)       RLQ(cm)       LUQ(cm)        LLQ(cm)
5.65
Biophysical Evaluation

Amniotic F.V:   Within normal limits       F. Tone:        Observed
F. Movement:    Observed                   Score:          [DATE]
F. Breathing:   Observed
Gestational Age

Clinical EDD:  28w 4d                                        EDD:   02/19/18
Best:          28w 4d     Det. By:  Clinical EDD             EDD:   02/19/18
Anatomy

Stomach:               Appears normal, left   Bladder:                Appears normal
sided
Cervix Uterus Adnexa

Cervix
Length:            3.3  cm.
Normal appearance by transabdominal scan.
Impression

Intrauterine pregnancy at 28+4 weeks with decreased fetal
movement
Normal amniotic fluid
BPP [DATE]
Recommendations

Continue clinical evaluation and management.

## 2019-08-15 IMAGING — US US MFM OB FOLLOW-UP
1 series · 12 of 28 positions shown · non-contrast
Comparison: none

[Series 1: us mfm ob follow-up · 31 acquisitions, 12 frames shown]
[im 2/31]
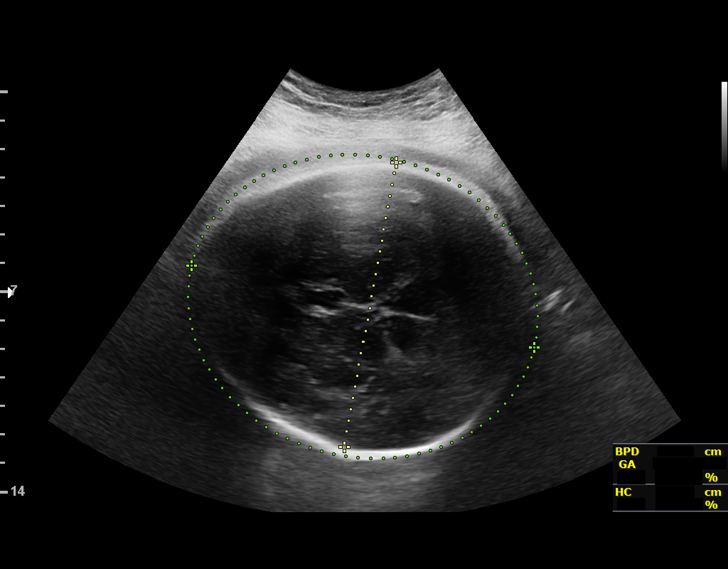
[im 4/31]
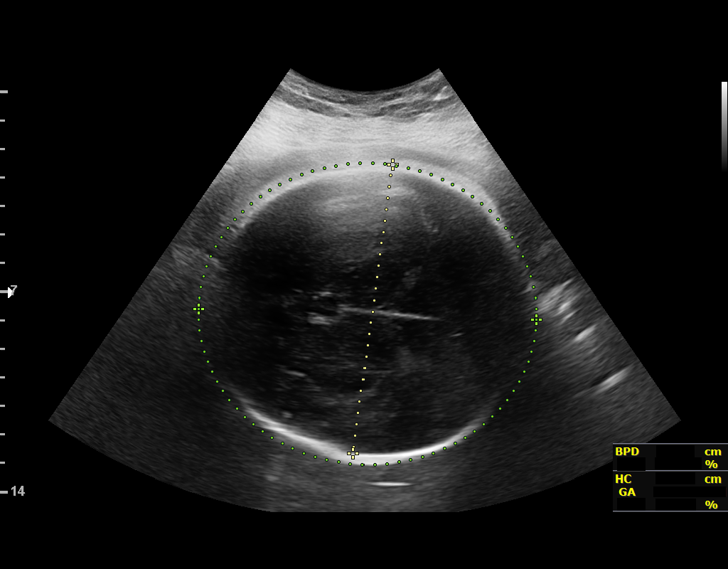
[im 6/31]
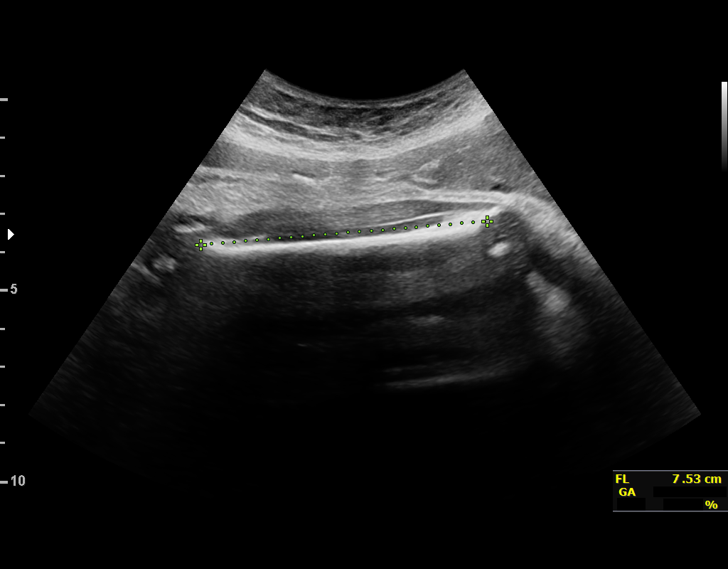
[im 9/31]
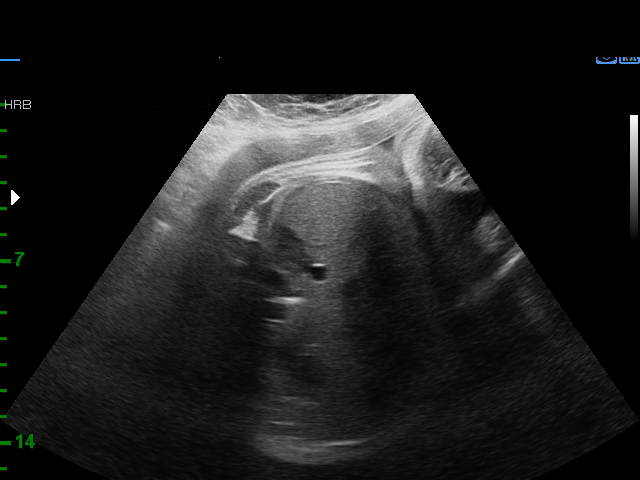
[im 12/31]
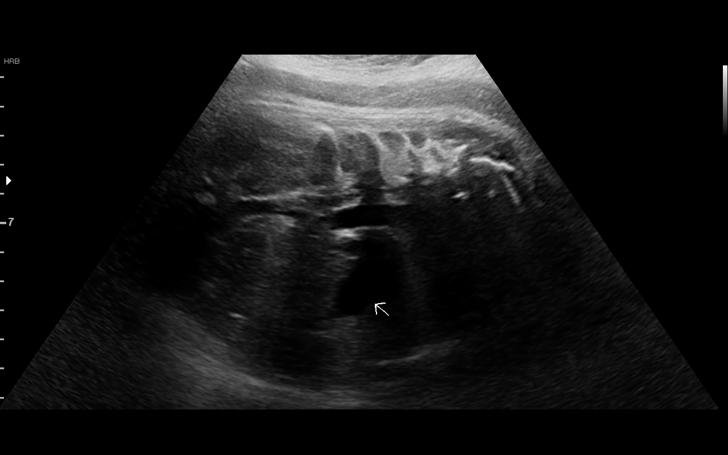
[im 14/31]
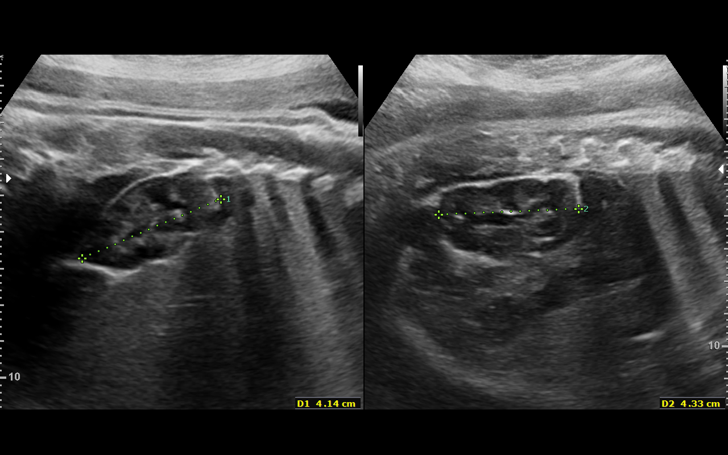
[im 17/31]
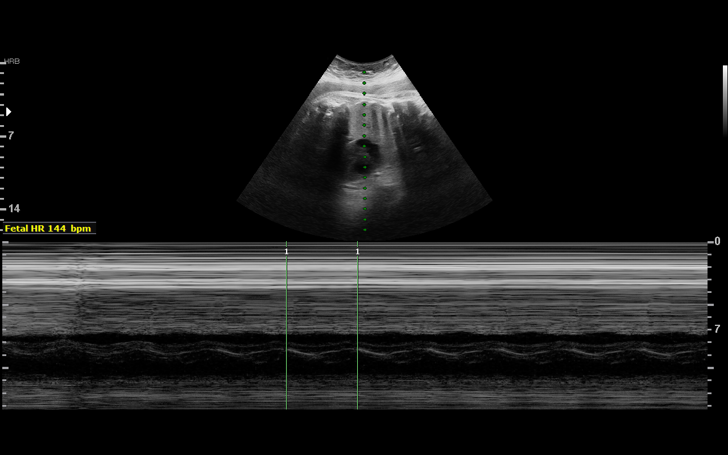
[im 19/31]
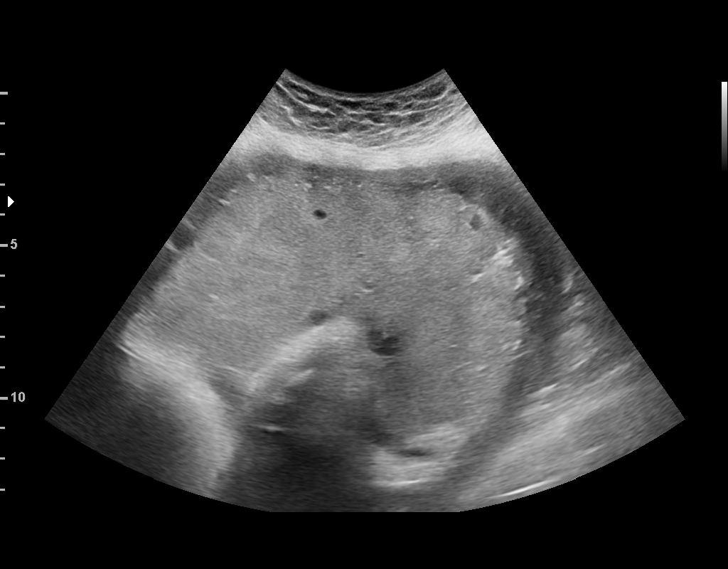
[im 22/31]
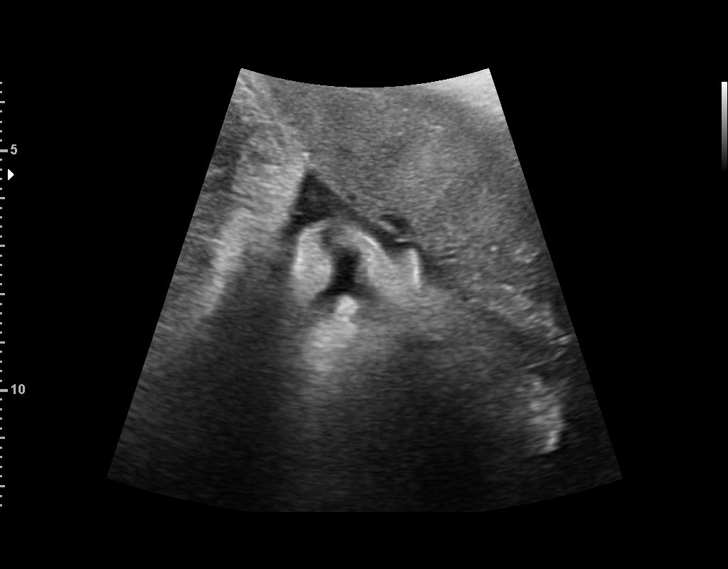
[im 25/31]
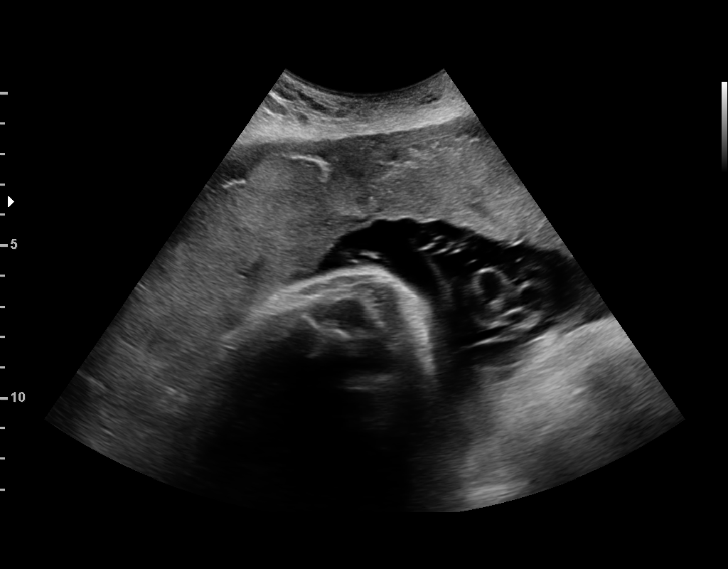
[im 27/31]
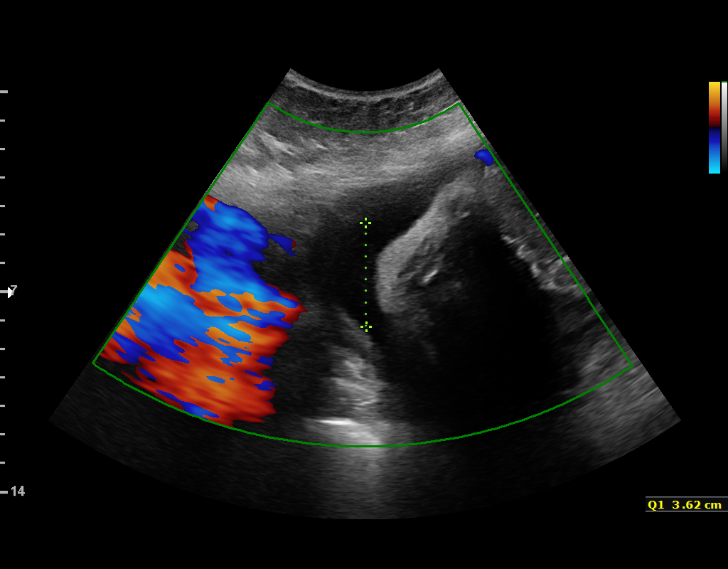
[im 29/31]
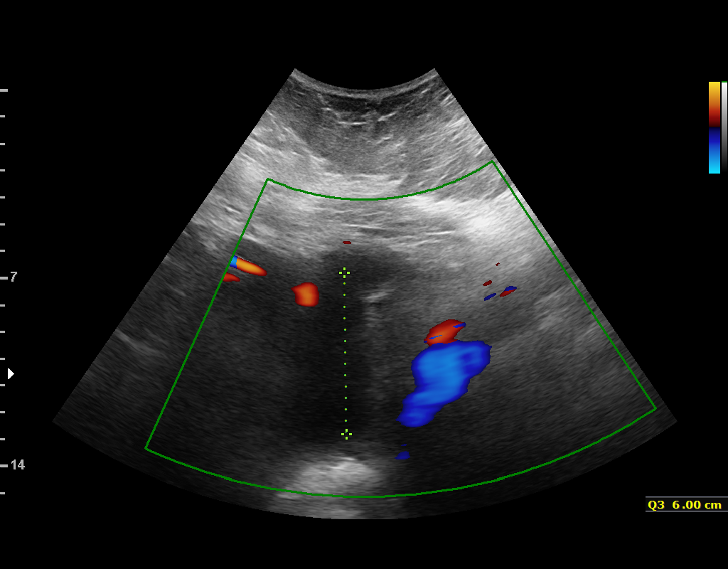

[12 of 28 positions shown; findings below may reference images not displayed]

Obstetrics &
Gynecology
[address]
[place]

1  RITU MAZZA              [id number]      [id number]     [id number]
Indications

37 weeks gestation of pregnancy
Encounter for other antenatal screening
follow-up
Maternal morbid obesity
Maternal care for excessive fetal growth,
third trimester, fetus unspecified
OB History

Blood Type:            Height:  5'7"   Weight (lb):  293       BMI:
Gravidity:    1
Fetal Evaluation

Num Of Fetuses:     1
Fetal Heart         144
Rate(bpm):
Cardiac Activity:   Observed
Presentation:       Cephalic
Placenta:           Anterior, above cervical os
P. Cord Insertion:  Previously Visualized

Amniotic Fluid
AFI FV:      Subjectively within normal limits

AFI Sum(cm)     %Tile       Largest Pocket(cm)
16.26           62          6
RUQ(cm)       RLQ(cm)       LUQ(cm)        LLQ(cm)
3.62          3.35          3.29           6
Biometry

BPD:     102.2  mm     G. Age:  N/A          > 99  %    CI:        82.26   %    70 - 86
FL/HC:      21.6   %    20.9 -
HC:      355.5  mm     G. Age:  41w 5d         97  %    HC/AC:      0.96        0.92 -
AC:      370.4  mm     G. Age:  41w 0d       > 97  %    FL/BPD:     75.1   %    71 - 87
FL:       76.8  mm     G. Age:  39w 2d         85  %    FL/AC:      20.7   %    20 - 24
HUM:      66.3  mm     G. Age:  38w 3d         90  %

Est. FW:    1976  gm      9 lb 5 oz   > 90  %
Gestational Age

U/S Today:     40w 5d                                        EDD:   01/29/18
Best:          37w 5d     Det. By:  Early Ultrasound         EDD:   02/19/18
(08/18/17)
Anatomy

Cranium:               Appears normal         Aortic Arch:            Not well visualized
Cavum:                 Appears normal         Ductal Arch:            Not well visualized
Ventricles:            Previously seen        Diaphragm:              Previously seen
Choroid Plexus:        Not well visualized    Stomach:                Appears normal, left
sided
Cerebellum:            Previously seen        Abdomen:                Appears normal
Posterior Fossa:       Not well visualized    Abdominal Wall:         Not well visualized
Nuchal Fold:           Not applicable (>20    Cord Vessels:           Not well visualized
wks GA)
Face:                  Orbits ps; profile not Kidneys:                Appear normal
well visualized
Lips:                  Previously seen        Bladder:                Appears normal
Thoracic:              Appears normal         Spine:                  Not well visualized
Heart:                 Not well visualized    Upper Extremities:      Visualized
RVOT:                  Not well visualized    Lower Extremities:      Visualized
LVOT:                  Previously seen

Other:  Male gender. Technically difficult due to maternal habitus and fetal
position.
Cervix Uterus Adnexa

Cervix
Not visualized (advanced GA >31wks)
Impression

SIUP at 37+5 weeks
Cephalic presentation
Normal interval anatomy; anatomic survey remains
incomplete; no gross abnormalities identified
Normal amniotic fluid volume
EFW > 90th %tile; AC > 97th %tile; 9+5; 1976 grams; fetus at
risk to be LGA
Recommendations

Follow-up as clinically indicated

## 2019-09-09 HISTORY — PX: ANAL FISTULOTOMY: SHX6423

## 2020-01-16 ENCOUNTER — Other Ambulatory Visit: Payer: Self-pay | Admitting: Obstetrics and Gynecology

## 2020-01-16 DIAGNOSIS — R102 Pelvic and perineal pain: Secondary | ICD-10-CM

## 2020-03-01 ENCOUNTER — Other Ambulatory Visit: Payer: Self-pay

## 2020-03-01 ENCOUNTER — Inpatient Hospital Stay (HOSPITAL_COMMUNITY): Payer: BC Managed Care – PPO

## 2020-03-01 ENCOUNTER — Inpatient Hospital Stay (HOSPITAL_COMMUNITY)
Admission: AD | Admit: 2020-03-01 | Discharge: 2020-03-01 | Disposition: A | Discharge status: Home / Self Care | Payer: BC Managed Care – PPO | Attending: Obstetrics & Gynecology | Admitting: Obstetrics & Gynecology

## 2020-03-01 ENCOUNTER — Encounter (HOSPITAL_COMMUNITY): Payer: Self-pay | Admitting: Obstetrics & Gynecology

## 2020-03-01 DIAGNOSIS — Z3A00 Weeks of gestation of pregnancy not specified: Secondary | ICD-10-CM | POA: Diagnosis not present

## 2020-03-01 DIAGNOSIS — Z3A01 Less than 8 weeks gestation of pregnancy: Secondary | ICD-10-CM | POA: Insufficient documentation

## 2020-03-01 DIAGNOSIS — F419 Anxiety disorder, unspecified: Secondary | ICD-10-CM | POA: Diagnosis not present

## 2020-03-01 DIAGNOSIS — Z3491 Encounter for supervision of normal pregnancy, unspecified, first trimester: Secondary | ICD-10-CM

## 2020-03-01 DIAGNOSIS — O99341 Other mental disorders complicating pregnancy, first trimester: Secondary | ICD-10-CM | POA: Diagnosis not present

## 2020-03-01 DIAGNOSIS — O209 Hemorrhage in early pregnancy, unspecified: Secondary | ICD-10-CM | POA: Diagnosis not present

## 2020-03-01 DIAGNOSIS — R109 Unspecified abdominal pain: Secondary | ICD-10-CM

## 2020-03-01 DIAGNOSIS — N939 Abnormal uterine and vaginal bleeding, unspecified: Secondary | ICD-10-CM

## 2020-03-01 DIAGNOSIS — F329 Major depressive disorder, single episode, unspecified: Secondary | ICD-10-CM | POA: Insufficient documentation

## 2020-03-01 DIAGNOSIS — Z885 Allergy status to narcotic agent status: Secondary | ICD-10-CM | POA: Insufficient documentation

## 2020-03-01 DIAGNOSIS — O26891 Other specified pregnancy related conditions, first trimester: Secondary | ICD-10-CM

## 2020-03-01 DIAGNOSIS — Z79899 Other long term (current) drug therapy: Secondary | ICD-10-CM | POA: Diagnosis not present

## 2020-03-01 LAB — CBC
HCT: 37.1 % (ref 36.0–46.0)
Hemoglobin: 12.4 g/dL (ref 12.0–15.0)
MCH: 28.6 pg (ref 26.0–34.0)
MCHC: 33.4 g/dL (ref 30.0–36.0)
MCV: 85.7 fL (ref 80.0–100.0)
Platelets: 240 10*3/uL (ref 150–400)
RBC: 4.33 MIL/uL (ref 3.87–5.11)
RDW: 12.8 % (ref 11.5–15.5)
WBC: 10.5 10*3/uL (ref 4.0–10.5)
nRBC: 0 % (ref 0.0–0.2)

## 2020-03-01 LAB — ABO/RH: ABO/RH(D): A POS

## 2020-03-01 LAB — COMPREHENSIVE METABOLIC PANEL
ALT: 12 U/L (ref 0–44)
AST: 14 U/L — ABNORMAL LOW (ref 15–41)
Albumin: 3.5 g/dL (ref 3.5–5.0)
Alkaline Phosphatase: 53 U/L (ref 38–126)
Anion gap: 7 (ref 5–15)
BUN: 13 mg/dL (ref 6–20)
CO2: 26 mmol/L (ref 22–32)
Calcium: 8.9 mg/dL (ref 8.9–10.3)
Chloride: 103 mmol/L (ref 98–111)
Creatinine, Ser: 0.63 mg/dL (ref 0.44–1.00)
GFR calc Af Amer: >60 mL/min (ref >60)
GFR calc non Af Amer: >60 mL/min (ref >60)
Glucose, Bld: 98 mg/dL (ref 70–99)
Potassium: 3.5 mmol/L (ref 3.5–5.1)
Sodium: 136 mmol/L (ref 135–145)
Total Bilirubin: 0.2 mg/dL — ABNORMAL LOW (ref 0.3–1.2)
Total Protein: 6.2 g/dL — ABNORMAL LOW (ref 6.5–8.1)

## 2020-03-01 LAB — WET PREP, GENITAL
Sperm: NONE SEEN
Trich, Wet Prep: NONE SEEN
Yeast Wet Prep HPF POC: NONE SEEN

## 2020-03-01 LAB — HCG, QUANTITATIVE, PREGNANCY: hCG, Beta Chain, Quant, S: 31888 m[IU]/mL — ABNORMAL HIGH (ref <5)

## 2020-03-01 LAB — POCT PREGNANCY, URINE: Preg Test, Ur: POSITIVE — AB

## 2020-03-01 NOTE — Discharge Instructions (Signed)
First Trimester of Pregnancy  The first trimester of pregnancy is from week 1 until the end of week 13 (months 1 through 3). During this time, your baby will begin to develop inside you. At 6-8 weeks, the eyes and face are formed, and the heartbeat can be seen on ultrasound. At the end of 12 weeks, all the baby's organs are formed. Prenatal care is all the medical care you receive before the birth of your baby. Make sure you get good prenatal care and follow all of your doctor's instructions. Follow these instructions at home: Medicines  Take over-the-counter and prescription medicines only as told by your doctor. Some medicines are safe and some medicines are not safe during pregnancy.  Take a prenatal vitamin that contains at least 600 micrograms (mcg) of folic acid.  If you have trouble pooping (constipation), take medicine that will make your stool soft (stool softener) if your doctor approves. Eating and drinking   Eat regular, healthy meals.  Your doctor will tell you the amount of weight gain that is right for you.  Avoid raw meat and uncooked cheese.  If you feel sick to your stomach (nauseous) or throw up (vomit): ? Eat 4 or 5 small meals a day instead of 3 large meals. ? Try eating a few soda crackers. ? Drink liquids between meals instead of during meals.  To prevent constipation: ? Eat foods that are high in fiber, like fresh fruits and vegetables, whole grains, and beans. ? Drink enough fluids to keep your pee (urine) clear or pale yellow. Activity  Exercise only as told by your doctor. Stop exercising if you have cramps or pain in your lower belly (abdomen) or low back.  Do not exercise if it is too hot, too humid, or if you are in a place of great height (high altitude).  Try to avoid standing for long periods of time. Move your legs often if you must stand in one place for a long time.  Avoid heavy lifting.  Wear low-heeled shoes. Sit and stand up  straight.  You can have sex unless your doctor tells you not to. Relieving pain and discomfort  Wear a good support bra if your breasts are sore.  Take warm water baths (sitz baths) to soothe pain or discomfort caused by hemorrhoids. Use hemorrhoid cream if your doctor says it is okay.  Rest with your legs raised if you have leg cramps or low back pain.  If you have puffy, bulging veins (varicose veins) in your legs: ? Wear support hose or compression stockings as told by your doctor. ? Raise (elevate) your feet for 15 minutes, 3-4 times a day. ? Limit salt in your food. Prenatal care  Schedule your prenatal visits by the twelfth week of pregnancy.  Write down your questions. Take them to your prenatal visits.  Keep all your prenatal visits as told by your doctor. This is important. Safety  Wear your seat belt at all times when driving.  Make a list of emergency phone numbers. The list should include numbers for family, friends, the hospital, and police and fire departments. General instructions  Ask your doctor for a referral to a local prenatal class. Begin classes no later than at the start of month 6 of your pregnancy.  Ask for help if you need counseling or if you need help with nutrition. Your doctor can give you advice or tell you where to go for help.  Do not use hot tubs, steam   rooms, or saunas.  Do not douche or use tampons or scented sanitary pads.  Do not cross your legs for long periods of time.  Avoid all herbs and alcohol. Avoid drugs that are not approved by your doctor.  Do not use any tobacco products, including cigarettes, chewing tobacco, and electronic cigarettes. If you need help quitting, ask your doctor. You may get counseling or other support to help you quit.  Avoid cat litter boxes and soil used by cats. These carry germs that can cause birth defects in the baby and can cause a loss of your baby (miscarriage) or stillbirth.  Visit your dentist.  At home, brush your teeth with a soft toothbrush. Be gentle when you floss. Contact a doctor if:  You are dizzy.  You have mild cramps or pressure in your lower belly.  You have a nagging pain in your belly area.  You continue to feel sick to your stomach, you throw up, or you have watery poop (diarrhea).  You have a bad smelling fluid coming from your vagina.  You have pain when you pee (urinate).  You have increased puffiness (swelling) in your face, hands, legs, or ankles. Get help right away if:  You have a fever.  You are leaking fluid from your vagina.  You have spotting or bleeding from your vagina.  You have very bad belly cramping or pain.  You gain or lose weight rapidly.  You throw up blood. It may look like coffee grounds.  You are around people who have German measles, fifth disease, or chickenpox.  You have a very bad headache.  You have shortness of breath.  You have any kind of trauma, such as from a fall or a car accident. Summary  The first trimester of pregnancy is from week 1 until the end of week 13 (months 1 through 3).  To take care of yourself and your unborn baby, you will need to eat healthy meals, take medicines only if your doctor tells you to do so, and do activities that are safe for you and your baby.  Keep all follow-up visits as told by your doctor. This is important as your doctor will have to ensure that your baby is healthy and growing well. This information is not intended to replace advice given to you by your health care provider. Make sure you discuss any questions you have with your health care provider. Document Revised: 12/16/2018 Document Reviewed: 09/02/2016 Elsevier Patient Education  2020 [employer]  

## 2020-03-01 NOTE — MAU Provider Note (Signed)
Patient Megan Morris is a 24 y.o. G2P1001  at [redacted]w[redacted]d here with complaints of abdominal pain on her right side. She endorses some pregnancy nausea, no vomiting, constipation, diarrhea, fever, SOB. She has a little spotting after intercourse.   She denies history of ectopic pregnancy.   She gets her care at [facility]; has not had an [place] yet. [facility] recommended that she come to MAU for evaluation after she called office and reported RLQ pain.    She was seen in on May 7 at [facility] and she had an US,which showed a ruptured 3 cm cyst on her right ovary. SHe had her period right after that. She is wondering if the pain on her right is a cyst.  History     CSN: [id number]  Arrival date and time: 03/01/20 1223   First Provider Initiated Contact with Patient 03/01/20 1409      Chief Complaint  Patient presents with  . Abdominal Pain  . Possible Pregnancy   Abdominal Pain The current episode started in the past 7 days. The problem occurs intermittently. The problem has been unchanged. The pain is at a severity of 6/10. The quality of the pain is cramping. The abdominal pain does not radiate. Pertinent negatives include no diarrhea or dysuria.    OB History    Gravida  2   Para  1   Term  1   Preterm      AB      Living  1     SAB      TAB      Ectopic      Multiple  0   Live Births  1           Past Medical History:  Diagnosis Date  . Anxiety   . Depressive disorder   . Morbid obesity with BMI of 40.0-44.9, adult [facility])     Past Surgical History:  Procedure Laterality Date  . ANAL FISTULOTOMY  2021  . TONSILLECTOMY      Family History  Problem Relation Age of Onset  . Diabetes Mother   . Fibromyalgia Mother   . Diabetes Father     Social History   Tobacco Use  . Smoking status: Never Smoker  . Smokeless tobacco: Never Used  Vaping Use  . Vaping Use: Never used  Substance Use Topics  . Alcohol use: Never  . Drug use: Never    Allergies:   Allergies  Allergen Reactions  . Hydrocodone Itching    Medications Prior to Admission  Medication Sig Dispense Refill Last Dose  . acetaminophen (TYLENOL) 500 MG tablet Take 500 mg by mouth every 6 (six) hours as needed for moderate pain.   Past Month at Unknown time  . Prenatal Vit-Fe Fumarate-FA (MULTIVITAMIN-PRENATAL) 27-0.8 MG TABS tablet Take 1 tablet by mouth daily at 12 noon.   02/29/2020 at Unknown time  . busPIRone (BUSPAR) 5 MG tablet Take 5 mg by mouth 3 (three) times daily.     Marland Kitchen escitalopram (LEXAPRO) 10 MG tablet Take 1 tablet (10 mg total) by mouth daily. Take 5mg  or half of a pill daily for 1 week then increase to whole pill after one week 30 tablet 0   . ibuprofen (ADVIL,MOTRIN) 600 MG tablet Take 1 tablet (600 mg total) by mouth every 6 (six) hours. (Patient not taking: Reported on 04/23/2018) 30 tablet 0   . Norgestimate-Ethinyl Estradiol Triphasic (TRI-SPRINTEC) 0.18/0.215/0.25 MG-35 MCG tablet Take 1 tablet by mouth daily.  Review of Systems  Constitutional: Negative.   HENT: Negative.   Gastrointestinal: Negative for diarrhea.  Genitourinary: Negative for dysuria.  Neurological: Negative.   Psychiatric/Behavioral: Negative.    Physical Exam   Blood pressure 115/68, pulse 91, temperature 98.9 F (37.2 C), temperature source Oral, resp. rate 18, height 5\' 8"  (1.727 m), weight 111.3 kg, last menstrual period 01/15/2020, SpO2 99 %.  Physical Exam  Constitutional: She appears well-developed.  HENT:  Head: Normocephalic.  GI: There is no abdominal tenderness.  Neurological: She is alert.  Speculum exam not done as patient has no bleeding or discharge. Bimanual not done.  MAU Course  Procedures  MDM -Blood type is A positive -wet prep normal -US shows live IUP; no other acute findings. [place] images viewed and interpreted by me.  -CBC and CMP unremarkable Assessment and Plan   1. Viable pregnancy in first trimester   2. Vaginal bleeding   -first  trimester precautions given.  -Follow up July 19 with [facility] -GC CT pending  Mervyn Skeeters [facility] 03/01/2020, 2:14 PM

## 2020-03-01 NOTE — MAU Note (Signed)
Is 6 wks preg, is having pain on RLQ, feels like when she had an ovarian cyst there 2 mon ago.called office, they just wanted her to get checked out.  Has not been seen in OB office yet, was confirmed at PCP.  Had some light spotting after intercourse last night, none today

## 2020-03-02 LAB — GC/CHLAMYDIA PROBE AMP (~~LOC~~) NOT AT ARMC
Chlamydia: NEGATIVE
Comment: NEGATIVE
Comment: NORMAL
Neisseria Gonorrhea: NEGATIVE

## 2020-03-26 LAB — OB RESULTS CONSOLE HEPATITIS B SURFACE ANTIGEN: Hepatitis B Surface Ag: NEGATIVE

## 2020-03-26 LAB — OB RESULTS CONSOLE RUBELLA ANTIBODY, IGM: Rubella: IMMUNE

## 2020-03-26 LAB — OB RESULTS CONSOLE HIV ANTIBODY (ROUTINE TESTING): HIV: NONREACTIVE

## 2020-03-26 LAB — OB RESULTS CONSOLE GC/CHLAMYDIA
Chlamydia: NEGATIVE
Gonorrhea: NEGATIVE

## 2020-03-26 LAB — OB RESULTS CONSOLE ABO/RH: RH Type: POSITIVE

## 2020-03-26 LAB — OB RESULTS CONSOLE RPR: RPR: NONREACTIVE

## 2020-03-26 LAB — OB RESULTS CONSOLE ANTIBODY SCREEN: Antibody Screen: NEGATIVE

## 2020-09-29 LAB — OB RESULTS CONSOLE GBS: GBS: NEGATIVE

## 2020-10-10 ENCOUNTER — Encounter (HOSPITAL_COMMUNITY): Payer: Self-pay

## 2020-10-10 ENCOUNTER — Other Ambulatory Visit: Payer: Self-pay | Admitting: Obstetrics & Gynecology

## 2020-10-10 NOTE — Patient Instructions (Signed)
Megan Morris  10/10/2020   Your procedure is scheduled on:  10/14/2020  Arrive at 0830 at Entrance C on [address] at [facility]  and [address]. You are invited to use the FREE valet parking or use the Visitor's parking deck.  Pick up the phone at the desk and dial [phone].  Call this number if you have problems the morning of surgery: [phone]  Remember:   Do not eat food:(After Midnight) Desps de medianoche.  Do not drink clear liquids: (After Midnight) Desps de medianoche.  Take these medicines the morning of surgery with A SIP OF WATER:  none   Do not wear jewelry, make-up or nail polish.  Do not wear lotions, powders, or perfumes. Do not wear deodorant.  Do not shave 48 hours prior to surgery.  Do not bring valuables to the hospital.  [facility] is not   responsible for any belongings or valuables brought to the hospital.  Contacts, dentures or bridgework may not be worn into surgery.  Leave suitcase in the car. After surgery it may be brought to your room.  For patients admitted to the hospital, checkout time is 11:00 AM the day of              discharge.      Please read over the following fact sheets that you were given:     Preparing for Surgery

## 2020-10-12 ENCOUNTER — Encounter (HOSPITAL_COMMUNITY)
Admission: RE | Admit: 2020-10-12 | Discharge: 2020-10-12 | Disposition: A | Discharge status: Home / Self Care | Payer: BC Managed Care – PPO | Source: Ambulatory Visit | Attending: Obstetrics & Gynecology | Admitting: Obstetrics & Gynecology

## 2020-10-12 ENCOUNTER — Other Ambulatory Visit: Payer: Self-pay

## 2020-10-12 ENCOUNTER — Other Ambulatory Visit (HOSPITAL_COMMUNITY): Payer: BC Managed Care – PPO

## 2020-10-12 DIAGNOSIS — Z01812 Encounter for preprocedural laboratory examination: Secondary | ICD-10-CM | POA: Insufficient documentation

## 2020-10-12 LAB — CBC
HCT: 31.2 % — ABNORMAL LOW (ref 36.0–46.0)
Hemoglobin: 9.8 g/dL — ABNORMAL LOW (ref 12.0–15.0)
MCH: 24.1 pg — ABNORMAL LOW (ref 26.0–34.0)
MCHC: 31.4 g/dL (ref 30.0–36.0)
MCV: 76.7 fL — ABNORMAL LOW (ref 80.0–100.0)
Platelets: 187 10*3/uL (ref 150–400)
RBC: 4.07 MIL/uL (ref 3.87–5.11)
RDW: 14.3 % (ref 11.5–15.5)
WBC: 10.2 10*3/uL (ref 4.0–10.5)
nRBC: 0 % (ref 0.0–0.2)

## 2020-10-12 LAB — BASIC METABOLIC PANEL
Anion gap: 9 (ref 5–15)
BUN: 7 mg/dL (ref 6–20)
CO2: 21 mmol/L — ABNORMAL LOW (ref 22–32)
Calcium: 8.9 mg/dL (ref 8.9–10.3)
Chloride: 104 mmol/L (ref 98–111)
Creatinine, Ser: 0.49 mg/dL (ref 0.44–1.00)
GFR, Estimated: >60 mL/min (ref >60)
Glucose, Bld: 90 mg/dL (ref 70–99)
Potassium: 3.8 mmol/L (ref 3.5–5.1)
Sodium: 134 mmol/L — ABNORMAL LOW (ref 135–145)

## 2020-10-12 LAB — TYPE AND SCREEN
ABO/RH(D): A POS
Antibody Screen: NEGATIVE

## 2020-10-13 LAB — RPR: RPR Ser Ql: NONREACTIVE

## 2020-10-14 ENCOUNTER — Inpatient Hospital Stay (HOSPITAL_COMMUNITY): Payer: BC Managed Care – PPO | Admitting: Anesthesiology

## 2020-10-14 ENCOUNTER — Other Ambulatory Visit: Payer: Self-pay

## 2020-10-14 ENCOUNTER — Encounter (HOSPITAL_COMMUNITY)
Admission: RE | Disposition: A | Discharge status: Home / Self Care | Payer: Self-pay | Source: Home / Self Care | Attending: Obstetrics & Gynecology

## 2020-10-14 ENCOUNTER — Encounter (HOSPITAL_COMMUNITY): Payer: Self-pay | Admitting: Obstetrics & Gynecology

## 2020-10-14 ENCOUNTER — Inpatient Hospital Stay (HOSPITAL_COMMUNITY)
Admission: RE | Admit: 2020-10-14 | Discharge: 2020-10-16 | DRG: 788 | Disposition: A | Discharge status: Home / Self Care | Payer: BC Managed Care – PPO | Attending: Obstetrics & Gynecology | Admitting: Obstetrics & Gynecology

## 2020-10-14 DIAGNOSIS — D509 Iron deficiency anemia, unspecified: Secondary | ICD-10-CM | POA: Diagnosis present

## 2020-10-14 DIAGNOSIS — Z3490 Encounter for supervision of normal pregnancy, unspecified, unspecified trimester: Secondary | ICD-10-CM | POA: Diagnosis present

## 2020-10-14 DIAGNOSIS — O322XX0 Maternal care for transverse and oblique lie, not applicable or unspecified: Secondary | ICD-10-CM | POA: Diagnosis present

## 2020-10-14 DIAGNOSIS — Z98891 History of uterine scar from previous surgery: Principal | ICD-10-CM

## 2020-10-14 DIAGNOSIS — O99344 Other mental disorders complicating childbirth: Secondary | ICD-10-CM | POA: Diagnosis present

## 2020-10-14 DIAGNOSIS — O99893 Other specified diseases and conditions complicating puerperium: Secondary | ICD-10-CM | POA: Diagnosis not present

## 2020-10-14 DIAGNOSIS — Z8616 Personal history of COVID-19: Secondary | ICD-10-CM

## 2020-10-14 DIAGNOSIS — O9081 Anemia of the puerperium: Secondary | ICD-10-CM | POA: Diagnosis not present

## 2020-10-14 DIAGNOSIS — F419 Anxiety disorder, unspecified: Secondary | ICD-10-CM | POA: Diagnosis present

## 2020-10-14 DIAGNOSIS — R200 Anesthesia of skin: Secondary | ICD-10-CM | POA: Diagnosis not present

## 2020-10-14 DIAGNOSIS — Z3A39 39 weeks gestation of pregnancy: Secondary | ICD-10-CM

## 2020-10-14 DIAGNOSIS — O99214 Obesity complicating childbirth: Secondary | ICD-10-CM | POA: Diagnosis present

## 2020-10-14 DIAGNOSIS — F32A Depression, unspecified: Secondary | ICD-10-CM | POA: Diagnosis present

## 2020-10-14 DIAGNOSIS — O26893 Other specified pregnancy related conditions, third trimester: Secondary | ICD-10-CM | POA: Diagnosis present

## 2020-10-14 SURGERY — Surgical Case
Anesthesia: Spinal | Wound class: Clean Contaminated

## 2020-10-14 MED ORDER — ONDANSETRON HCL 4 MG/2ML IJ SOLN: 4.0000 mg | Freq: Three times a day (TID) | INTRAMUSCULAR | Status: DC | PRN

## 2020-10-14 MED ORDER — KETOROLAC TROMETHAMINE 30 MG/ML IJ SOLN: 30.0000 mg | Freq: Four times a day (QID) | INTRAMUSCULAR | Status: DC | PRN

## 2020-10-14 MED ORDER — ACETAMINOPHEN 500 MG PO TABS
1000.0000 mg | ORAL_TABLET | Freq: Four times a day (QID) | ORAL | Status: DC
  Administered 2020-10-14 – 2020-10-16 (×7): 1000 mg via ORAL
  Filled 2020-10-14 (×8): qty 2

## 2020-10-14 MED ORDER — SODIUM CHLORIDE 0.9% FLUSH: 3.0000 mL | INTRAVENOUS | Status: DC | PRN

## 2020-10-14 MED ORDER — METOCLOPRAMIDE HCL 5 MG/ML IJ SOLN
INTRAMUSCULAR | Status: DC | PRN
  Administered 2020-10-14: 10 mg via INTRAVENOUS

## 2020-10-14 MED ORDER — POVIDONE-IODINE 10 % EX SWAB: 2.0000 "application " | Freq: Once | CUTANEOUS | Status: DC

## 2020-10-14 MED ORDER — MENTHOL 3 MG MT LOZG: 1.0000 | LOZENGE | OROMUCOSAL | Status: DC | PRN

## 2020-10-14 MED ORDER — NALOXONE HCL 4 MG/10ML IJ SOLN
1.0000 ug/kg/h | INTRAVENOUS | Status: DC | PRN
  Filled 2020-10-14: qty 5

## 2020-10-14 MED ORDER — KETOROLAC TROMETHAMINE 30 MG/ML IJ SOLN
30.0000 mg | Freq: Four times a day (QID) | INTRAMUSCULAR | Status: AC
  Administered 2020-10-15 (×3): 30 mg via INTRAVENOUS
  Filled 2020-10-14 (×3): qty 1

## 2020-10-14 MED ORDER — FENTANYL CITRATE (PF) 100 MCG/2ML IJ SOLN
25.0000 ug | INTRAMUSCULAR | Status: DC | PRN
  Administered 2020-10-14: 50 ug via INTRAVENOUS

## 2020-10-14 MED ORDER — DEXTROSE 5 % IV SOLN: 3.0000 g | INTRAVENOUS | Status: DC

## 2020-10-14 MED ORDER — SOD CITRATE-CITRIC ACID 500-334 MG/5ML PO SOLN
ORAL | Status: AC
  Filled 2020-10-14: qty 30

## 2020-10-14 MED ORDER — DIPHENHYDRAMINE HCL 50 MG/ML IJ SOLN: 12.5000 mg | INTRAMUSCULAR | Status: DC | PRN

## 2020-10-14 MED ORDER — LACTATED RINGERS IV SOLN
500.0000 mL | Freq: Once | INTRAVENOUS | Status: AC
  Administered 2020-10-14: 500 mL via INTRAVENOUS

## 2020-10-14 MED ORDER — KETOROLAC TROMETHAMINE 30 MG/ML IJ SOLN: 30.0000 mg | Freq: Four times a day (QID) | INTRAMUSCULAR | Status: AC | PRN

## 2020-10-14 MED ORDER — NALBUPHINE HCL 10 MG/ML IJ SOLN
5.0000 mg | Freq: Once | INTRAMUSCULAR | Status: DC | PRN
Start: 2020-10-14 — End: 2020-10-16

## 2020-10-14 MED ORDER — PHENYLEPHRINE 40 MCG/ML (10ML) SYRINGE FOR IV PUSH (FOR BLOOD PRESSURE SUPPORT): 80.0000 ug | PREFILLED_SYRINGE | INTRAVENOUS | Status: DC | PRN

## 2020-10-14 MED ORDER — OXYTOCIN-SODIUM CHLORIDE 30-0.9 UT/500ML-% IV SOLN
INTRAVENOUS | Status: DC | PRN
  Administered 2020-10-14: 300 mL via INTRAVENOUS

## 2020-10-14 MED ORDER — LACTATED RINGERS IV SOLN: 500.0000 mL | Freq: Once | INTRAVENOUS | Status: DC

## 2020-10-14 MED ORDER — LACTATED RINGERS IV SOLN
INTRAVENOUS | Status: DC
  Administered 2020-10-14: 125 mL/h via INTRAVENOUS

## 2020-10-14 MED ORDER — SCOPOLAMINE 1 MG/3DAYS TD PT72
1.0000 | MEDICATED_PATCH | Freq: Once | TRANSDERMAL | Status: DC
  Administered 2020-10-14: 1.5 mg via TRANSDERMAL
  Filled 2020-10-14: qty 1

## 2020-10-14 MED ORDER — LIDOCAINE-EPINEPHRINE (PF) 2 %-1:200000 IJ SOLN
INTRAMUSCULAR | Status: DC | PRN
  Administered 2020-10-14: 5 mL via EPIDURAL
  Administered 2020-10-14: 10 mL via EPIDURAL
  Administered 2020-10-14: 5 mL via EPIDURAL

## 2020-10-14 MED ORDER — KETOROLAC TROMETHAMINE 30 MG/ML IJ SOLN
INTRAMUSCULAR | Status: AC
  Filled 2020-10-14: qty 1

## 2020-10-14 MED ORDER — LIDOCAINE-EPINEPHRINE (PF) 2 %-1:200000 IJ SOLN
INTRAMUSCULAR | Status: DC | PRN
  Administered 2020-10-14: 5 mL via EPIDURAL

## 2020-10-14 MED ORDER — DIPHENHYDRAMINE HCL 25 MG PO CAPS: 25.0000 mg | ORAL_CAPSULE | Freq: Four times a day (QID) | ORAL | Status: DC | PRN

## 2020-10-14 MED ORDER — FENTANYL-BUPIVACAINE-NACL 0.5-0.125-0.9 MG/250ML-% EP SOLN
12.0000 mL/h | EPIDURAL | Status: DC | PRN
  Administered 2020-10-14: 12 mL/h via EPIDURAL

## 2020-10-14 MED ORDER — NALOXONE HCL 0.4 MG/ML IJ SOLN: 0.4000 mg | INTRAMUSCULAR | Status: DC | PRN

## 2020-10-14 MED ORDER — PROPOFOL 10 MG/ML IV BOLUS
INTRAVENOUS | Status: AC
  Filled 2020-10-14: qty 20

## 2020-10-14 MED ORDER — ACETAMINOPHEN 325 MG PO TABS: 650.0000 mg | ORAL_TABLET | ORAL | Status: DC | PRN

## 2020-10-14 MED ORDER — EPHEDRINE 5 MG/ML INJ: 10.0000 mg | INTRAVENOUS | Status: DC | PRN

## 2020-10-14 MED ORDER — NALBUPHINE HCL 10 MG/ML IJ SOLN: 5.0000 mg | INTRAMUSCULAR | Status: DC | PRN

## 2020-10-14 MED ORDER — OXYTOCIN BOLUS FROM INFUSION: 333.0000 mL | Freq: Once | INTRAVENOUS | Status: DC

## 2020-10-14 MED ORDER — PHENYLEPHRINE 40 MCG/ML (10ML) SYRINGE FOR IV PUSH (FOR BLOOD PRESSURE SUPPORT)
80.0000 ug | PREFILLED_SYRINGE | INTRAVENOUS | Status: DC | PRN
  Filled 2020-10-14: qty 10

## 2020-10-14 MED ORDER — CEFAZOLIN SODIUM-DEXTROSE 2-4 GM/100ML-% IV SOLN
INTRAVENOUS | Status: AC
  Filled 2020-10-14: qty 100

## 2020-10-14 MED ORDER — DEXAMETHASONE SODIUM PHOSPHATE 4 MG/ML IJ SOLN
INTRAMUSCULAR | Status: AC
  Filled 2020-10-14: qty 2

## 2020-10-14 MED ORDER — SENNOSIDES-DOCUSATE SODIUM 8.6-50 MG PO TABS
2.0000 | ORAL_TABLET | Freq: Every day | ORAL | Status: DC
  Administered 2020-10-15 – 2020-10-16 (×2): 2 via ORAL
  Filled 2020-10-14 (×2): qty 2

## 2020-10-14 MED ORDER — OXYTOCIN-SODIUM CHLORIDE 30-0.9 UT/500ML-% IV SOLN
INTRAVENOUS | Status: AC
  Filled 2020-10-14: qty 500

## 2020-10-14 MED ORDER — LACTATED RINGERS IV SOLN: INTRAVENOUS | Status: DC

## 2020-10-14 MED ORDER — SOD CITRATE-CITRIC ACID 500-334 MG/5ML PO SOLN: 30.0000 mL | ORAL | Status: DC | PRN

## 2020-10-14 MED ORDER — NALBUPHINE HCL 10 MG/ML IJ SOLN: 5.0000 mg | Freq: Once | INTRAMUSCULAR | Status: DC | PRN

## 2020-10-14 MED ORDER — FENTANYL CITRATE (PF) 100 MCG/2ML IJ SOLN
INTRAMUSCULAR | Status: AC
  Filled 2020-10-14: qty 2

## 2020-10-14 MED ORDER — DIPHENHYDRAMINE HCL 25 MG PO CAPS: 25.0000 mg | ORAL_CAPSULE | ORAL | Status: DC | PRN

## 2020-10-14 MED ORDER — WITCH HAZEL-GLYCERIN EX PADS: 1.0000 "application " | MEDICATED_PAD | CUTANEOUS | Status: DC | PRN

## 2020-10-14 MED ORDER — OXYTOCIN-SODIUM CHLORIDE 30-0.9 UT/500ML-% IV SOLN: 2.5000 [IU]/h | INTRAVENOUS | Status: DC

## 2020-10-14 MED ORDER — HYDROXYZINE HCL 50 MG PO TABS: 50.0000 mg | ORAL_TABLET | Freq: Four times a day (QID) | ORAL | Status: DC | PRN

## 2020-10-14 MED ORDER — CEFAZOLIN SODIUM-DEXTROSE 2-4 GM/100ML-% IV SOLN: 2.0000 g | INTRAVENOUS | Status: DC

## 2020-10-14 MED ORDER — LIDOCAINE HCL (PF) 1 % IJ SOLN: 30.0000 mL | INTRAMUSCULAR | Status: DC | PRN

## 2020-10-14 MED ORDER — ONDANSETRON HCL 4 MG/2ML IJ SOLN: 4.0000 mg | Freq: Four times a day (QID) | INTRAMUSCULAR | Status: DC | PRN

## 2020-10-14 MED ORDER — COCONUT OIL OIL: 1.0000 "application " | TOPICAL_OIL | Status: DC | PRN

## 2020-10-14 MED ORDER — ACETAMINOPHEN 500 MG PO TABS: 1000.0000 mg | ORAL_TABLET | Freq: Four times a day (QID) | ORAL | Status: DC

## 2020-10-14 MED ORDER — PRENATAL MULTIVITAMIN CH
1.0000 | ORAL_TABLET | Freq: Every day | ORAL | Status: DC
  Administered 2020-10-15 – 2020-10-16 (×2): 1 via ORAL
  Filled 2020-10-14 (×2): qty 1

## 2020-10-14 MED ORDER — ENOXAPARIN SODIUM 60 MG/0.6ML ~~LOC~~ SOLN
60.0000 mg | SUBCUTANEOUS | Status: DC
  Administered 2020-10-15 – 2020-10-16 (×2): 60 mg via SUBCUTANEOUS
  Filled 2020-10-14 (×2): qty 0.6

## 2020-10-14 MED ORDER — OXYCODONE HCL 5 MG PO TABS
5.0000 mg | ORAL_TABLET | ORAL | Status: DC | PRN
  Administered 2020-10-15 – 2020-10-16 (×3): 5 mg via ORAL
  Filled 2020-10-14 (×3): qty 1

## 2020-10-14 MED ORDER — IBUPROFEN 800 MG PO TABS
800.0000 mg | ORAL_TABLET | Freq: Three times a day (TID) | ORAL | Status: DC
  Administered 2020-10-15 – 2020-10-16 (×3): 800 mg via ORAL
  Filled 2020-10-14 (×3): qty 1

## 2020-10-14 MED ORDER — MISOPROSTOL 25 MCG QUARTER TABLET: 25.0000 ug | ORAL_TABLET | ORAL | Status: DC | PRN

## 2020-10-14 MED ORDER — KETOROLAC TROMETHAMINE 30 MG/ML IJ SOLN
30.0000 mg | Freq: Once | INTRAMUSCULAR | Status: AC | PRN
  Administered 2020-10-14: 30 mg via INTRAVENOUS

## 2020-10-14 MED ORDER — FENTANYL CITRATE (PF) 100 MCG/2ML IJ SOLN
INTRAMUSCULAR | Status: DC | PRN
  Administered 2020-10-14 (×2): 100 ug via INTRAVENOUS

## 2020-10-14 MED ORDER — SCOPOLAMINE 1 MG/3DAYS TD PT72: 1.0000 | MEDICATED_PATCH | Freq: Once | TRANSDERMAL | Status: DC

## 2020-10-14 MED ORDER — LACTATED RINGERS IV SOLN: INTRAVENOUS | Status: DC | PRN

## 2020-10-14 MED ORDER — FENTANYL CITRATE (PF) 100 MCG/2ML IJ SOLN: 50.0000 ug | INTRAMUSCULAR | Status: DC | PRN

## 2020-10-14 MED ORDER — MORPHINE SULFATE (PF) 10 MG/ML IV SOLN
INTRAVENOUS | Status: DC | PRN
  Administered 2020-10-14: 3 mg via EPIDURAL

## 2020-10-14 MED ORDER — OXYTOCIN-SODIUM CHLORIDE 30-0.9 UT/500ML-% IV SOLN
1.0000 m[IU]/min | INTRAVENOUS | Status: DC
  Administered 2020-10-14: 2 m[IU]/min via INTRAVENOUS
  Filled 2020-10-14: qty 500

## 2020-10-14 MED ORDER — SIMETHICONE 80 MG PO CHEW
80.0000 mg | CHEWABLE_TABLET | ORAL | Status: DC | PRN
  Administered 2020-10-15: 80 mg via ORAL
  Filled 2020-10-14: qty 1

## 2020-10-14 MED ORDER — DIBUCAINE (PERIANAL) 1 % EX OINT: 1.0000 "application " | TOPICAL_OINTMENT | CUTANEOUS | Status: DC | PRN

## 2020-10-14 MED ORDER — DEXTROSE 5 % IV SOLN
INTRAVENOUS | Status: DC | PRN
  Administered 2020-10-14: 2 g via INTRAVENOUS

## 2020-10-14 MED ORDER — POLYSACCHARIDE IRON COMPLEX 150 MG PO CAPS
150.0000 mg | ORAL_CAPSULE | Freq: Every day | ORAL | Status: DC
  Administered 2020-10-15 – 2020-10-16 (×2): 150 mg via ORAL
  Filled 2020-10-14 (×2): qty 1

## 2020-10-14 MED ORDER — DEXAMETHASONE SODIUM PHOSPHATE 4 MG/ML IJ SOLN
INTRAMUSCULAR | Status: DC | PRN
  Administered 2020-10-14: 8 mg via INTRAVENOUS

## 2020-10-14 MED ORDER — MORPHINE SULFATE (PF) 0.5 MG/ML IJ SOLN
INTRAMUSCULAR | Status: AC
  Filled 2020-10-14: qty 10

## 2020-10-14 MED ORDER — PHENYLEPHRINE HCL (PRESSORS) 10 MG/ML IV SOLN
INTRAVENOUS | Status: DC | PRN
  Administered 2020-10-14: 80 ug via INTRAVENOUS

## 2020-10-14 MED ORDER — NALBUPHINE HCL 10 MG/ML IJ SOLN
5.0000 mg | Freq: Once | INTRAMUSCULAR | Status: DC | PRN
Start: 2020-10-14 — End: 2020-10-14

## 2020-10-14 MED ORDER — METOCLOPRAMIDE HCL 5 MG/ML IJ SOLN
INTRAMUSCULAR | Status: AC
  Filled 2020-10-14: qty 2

## 2020-10-14 MED ORDER — ONDANSETRON HCL 4 MG/2ML IJ SOLN
INTRAMUSCULAR | Status: DC | PRN
  Administered 2020-10-14: 4 mg via INTRAVENOUS

## 2020-10-14 MED ORDER — MAGNESIUM OXIDE 400 (241.3 MG) MG PO TABS
400.0000 mg | ORAL_TABLET | Freq: Every day | ORAL | Status: DC
  Administered 2020-10-15 – 2020-10-16 (×2): 400 mg via ORAL
  Filled 2020-10-14 (×2): qty 1

## 2020-10-14 MED ORDER — OXYTOCIN-SODIUM CHLORIDE 30-0.9 UT/500ML-% IV SOLN: 2.5000 [IU]/h | INTRAVENOUS | Status: AC

## 2020-10-14 MED ORDER — ONDANSETRON HCL 4 MG/2ML IJ SOLN
INTRAMUSCULAR | Status: AC
  Filled 2020-10-14: qty 2

## 2020-10-14 MED ORDER — TERBUTALINE SULFATE 1 MG/ML IJ SOLN: 0.2500 mg | Freq: Once | INTRAMUSCULAR | Status: DC | PRN

## 2020-10-14 MED ORDER — ZOLPIDEM TARTRATE 5 MG PO TABS: 5.0000 mg | ORAL_TABLET | Freq: Every evening | ORAL | Status: DC | PRN

## 2020-10-14 MED ORDER — FENTANYL-BUPIVACAINE-NACL 0.5-0.125-0.9 MG/250ML-% EP SOLN
12.0000 mL/h | EPIDURAL | Status: DC | PRN
  Filled 2020-10-14: qty 250

## 2020-10-14 MED ORDER — LACTATED RINGERS IV SOLN: 500.0000 mL | INTRAVENOUS | Status: DC | PRN

## 2020-10-14 SURGICAL SUPPLY — 41 items
BENZOIN TINCTURE PRP APPL 2/3 (GAUZE/BANDAGES/DRESSINGS) ×2 IMPLANT
CHLORAPREP W/TINT 26ML (MISCELLANEOUS) ×2 IMPLANT
CLAMP CORD UMBIL (MISCELLANEOUS) IMPLANT
CLOTH BEACON ORANGE TIMEOUT ST (SAFETY) ×2 IMPLANT
DERMABOND ADVANCED (GAUZE/BANDAGES/DRESSINGS)
DERMABOND ADVANCED .7 DNX12 (GAUZE/BANDAGES/DRESSINGS) IMPLANT
DRAPE C SECTION CLR SCREEN (DRAPES) ×2 IMPLANT
DRSG OPSITE POSTOP 4X10 (GAUZE/BANDAGES/DRESSINGS) ×2 IMPLANT
ELECT REM PT RETURN 9FT ADLT (ELECTROSURGICAL) ×2
ELECTRODE REM PT RTRN 9FT ADLT (ELECTROSURGICAL) ×1 IMPLANT
EXTRACTOR VACUUM M CUP 4 TUBE (SUCTIONS) IMPLANT
GLOVE BIOGEL PI IND STRL 7.0 (GLOVE) ×2 IMPLANT
GLOVE BIOGEL PI INDICATOR 7.0 (GLOVE) ×2
GLOVE SURG SS PI 6.5 STRL IVOR (GLOVE) ×2 IMPLANT
GOWN STRL REUS W/TWL LRG LVL3 (GOWN DISPOSABLE) ×4 IMPLANT
HEMOSTAT ARISTA ABSORB 3G PWDR (HEMOSTASIS) ×2 IMPLANT
KIT ABG SYR 3ML LUER SLIP (SYRINGE) IMPLANT
NEEDLE HYPO 25X5/8 SAFETYGLIDE (NEEDLE) IMPLANT
NS IRRIG 1000ML POUR BTL (IV SOLUTION) ×2 IMPLANT
PACK C SECTION WH (CUSTOM PROCEDURE TRAY) ×2 IMPLANT
PAD OB MATERNITY 4.3X12.25 (PERSONAL CARE ITEMS) ×2 IMPLANT
PENCIL SMOKE EVAC W/HOLSTER (ELECTROSURGICAL) ×2 IMPLANT
RTRCTR C-SECT PINK 25CM LRG (MISCELLANEOUS) ×2 IMPLANT
STRIP CLOSURE SKIN 1/2X4 (GAUZE/BANDAGES/DRESSINGS) ×2 IMPLANT
SUT CHROMIC 1 CT1 27 (SUTURE) ×2 IMPLANT
SUT CHROMIC 1 CTX 36 (SUTURE) IMPLANT
SUT CHROMIC 2 0 CT 1 (SUTURE) ×2 IMPLANT
SUT MON AB 4-0 PS1 27 (SUTURE) ×4 IMPLANT
SUT PLAIN 1 NONE 54 (SUTURE) IMPLANT
SUT PLAIN 2 0 (SUTURE)
SUT PLAIN 2 0 XLH (SUTURE) ×4 IMPLANT
SUT PLAIN ABS 2-0 CT1 27XMFL (SUTURE) IMPLANT
SUT VIC AB 0 CT1 27 (SUTURE) ×2
SUT VIC AB 0 CT1 27XBRD ANBCTR (SUTURE) ×2 IMPLANT
SUT VIC AB 0 CTX 36 (SUTURE) ×1
SUT VIC AB 0 CTX36XBRD ANBCTRL (SUTURE) ×1 IMPLANT
SUT VIC AB 1 CTX 36 (SUTURE) ×2
SUT VIC AB 1 CTX36XBRD ANBCTRL (SUTURE) ×2 IMPLANT
TOWEL OR 17X24 6PK STRL BLUE (TOWEL DISPOSABLE) ×2 IMPLANT
TRAY FOLEY W/BAG SLVR 14FR LF (SET/KITS/TRAYS/PACK) ×2 IMPLANT
WATER STERILE IRR 1000ML POUR (IV SOLUTION) ×2 IMPLANT

## 2020-10-14 NOTE — Anesthesia Procedure Notes (Signed)
Epidural Patient location during procedure: OB Start time: 10/14/2020 12:50 PM End time: 10/14/2020 1:00 PM  Staffing Anesthesiologist: Elmer Picker, MD Performed: anesthesiologist   Preanesthetic Checklist Completed: patient identified, IV checked, risks and benefits discussed, monitors and equipment checked, pre-op evaluation and timeout performed  Epidural Patient position: sitting Prep: DuraPrep and site prepped and draped Patient monitoring: continuous pulse ox, blood pressure, heart rate and cardiac monitor Approach: midline Location: L3-L4 Injection technique: LOR air  Needle:  Needle type: Tuohy  Needle gauge: 17 G Needle length: 9 cm Needle insertion depth: 7 cm Catheter type: closed end flexible Catheter size: 19 Gauge Catheter at skin depth: 13 cm Test dose: negative  Assessment Sensory level: T8 Events: blood not aspirated, injection not painful, no injection resistance, no paresthesia and negative IV test  Additional Notes Patient identified. Risks/Benefits/Options discussed with patient including but not limited to bleeding, infection, nerve damage, paralysis, failed block, incomplete pain control, headache, blood pressure changes, nausea, vomiting, reactions to medication both or allergic, itching and postpartum back pain. Confirmed with bedside nurse the patient's most recent platelet count. Confirmed with patient that they are not currently taking any anticoagulation, have any bleeding history or any family history of bleeding disorders. Patient expressed understanding and wished to proceed. All questions were answered. Sterile technique was used throughout the entire procedure. Please see nursing notes for vital signs. Test dose was given through epidural catheter and negative prior to continuing to dose epidural or start infusion. Warning signs of high block given to the patient including shortness of breath, tingling/numbness in hands, complete motor block, or  any concerning symptoms with instructions to call for help. Patient was given instructions on fall risk and not to get out of bed. All questions and concerns addressed with instructions to call with any issues or inadequate analgesia.  Reason for block:procedure for pain

## 2020-10-14 NOTE — Progress Notes (Signed)
Labor Progress Note  Demesha Boorman is a 25 y.o. female, G2P1001, IUP at 22 weeks, presenting for IOL for term with h/o macrosomia. EFW 8.15lbs 96% on 10/09/2020, AFI 19. H/O LGA, macrosomia with first pregnancy. H/O anxiety on Buspar 10mg  BID PRN, last dose was 2 weeks ago, depression no meds, currently was on lexapro  And stopped early pregnancy. Pt endorse + Fm. Denies vaginal leakage. Denies vaginal bleeding. Feels occational cxt.   Subjective: Pt was resting well in bed and cxt, now comfortable with epidural, reviewed R/B/A of AROM, pt consent if fetal head well applied, during SVE foley buld was out, tp was noted to be 6cm, I was sweeping the membranes looking to palpate fetal head, I was unable to find fetal head, then during the next cxt the head came down and a hand came down as well, my finger went through her bag, and the fetal head pressed against the pelvis inlet, fetus descended to zero station, I had my hand still inside only allowing a little bit of water out at a time, moderate amount of clear fluid noted with Cat 1 strip, then two mins later the fetal hand came across the cervix and grabbed my finger, when that occurred, I felt the cord down now, immediate put pt in trendelenburg, stopped pitocin, called DR and called code cesarean. The fetal heart rate dipped to the 70s for 1 min, I remained with my hand inside to apply pressure on the fetal head, baseline was 130s. An RN replaced her hand with mine as we rolled back to the OR. Patient Active Problem List   Diagnosis Date Noted  . Transverse lie of fetus 10/14/2020  . Encounter for induction of labor 10/14/2020  . Normal labor 02/18/2018  . Obesity affecting pregnancy in third trimester 02/18/2018   Objective: BP 115/64   Pulse 97   Temp 99.4 F (37.4 C) (Oral)   Resp 16   LMP 01/15/2020   SpO2 98%   Breastfeeding Unknown  No intake/output data recorded. Total I/O In: 3000 [I.V.:3000] Out: 966 [Urine:400;  Blood:566] NST: FHR baseline 135 bpm, Variability: moderate, Accelerations:present, Decelerations:  Absent= Cat 1/Reactive CTX:  Regular,  Q3, lasting 50-100 seconds Uterus gravid, soft non tender, moderate to palpate with contractions.  SVE:  Dilation: 5 Effacement (%): 70 Station: Ballotable Exam by:: CNM Cyndra Feinberg M Pitocin at 63mUn/min Finger went through membranes, moderate amount clear fluids.   Assessment:  Chastelyn Athens is a 25 y.o. female, G2P1001, IUP at 66 weeks, presenting for IOL for term with h/o macrosomia. EFW 8.15lbs 96% on 10/09/2020, AFI 19. H/O LGA, macrosomia with first pregnancy. H/O anxiety on Buspar 10mg  BID PRN, last dose was 2 weeks ago, depression no meds, currently was on lexapro  And stopped early pregnancy. Comfortable with epidural, foley out, planned for AROM, fetus was ballotable, my finger AROM through her sac with cord prolapse. Code cesarean called and Dr 12/07/2020 notified, please see subjective for further details of incident.  Patient Active Problem List   Diagnosis Date Noted  . Transverse lie of fetus 10/14/2020  . Encounter for induction of labor 10/14/2020  . Normal labor 02/18/2018  . Obesity affecting pregnancy in third trimester 02/18/2018   NICHD: Category 1  Membranes:  1422 x 0hrs, no s/s of infection  Induction:    Foley Bulb: inserted  Inserted at 1000, out at 1400  Pitocin - 6  Pain management:  IV pain management: xPRN             Epidural placement:  at 1250 on 2/6  GBS Negative  Plan: Code Caesarean for cord prolapse Unstable lye during third trimester, vertex on admission. SCD Foley Preop orders RN to keep her hand in.   [facility] aware of plan and verbalized agreement.   Dale Marion, NP-C, CNM, MSN 10/14/2020. 3:54 PM

## 2020-10-14 NOTE — Lactation Note (Signed)
This note was copied from a baby's chart. Lactation Consultation Note  Patient Name: Megan Morris [id number] Date: 10/14/2020 Reason for consult: Initial assessment;Term;Other (Comment) (LGA) Age:26 hours  Visited with mom of 5 hours old FT female, she's a P2 but not experienced BF. Mom noted her first baby never latched and she just pumped and bottle fed for 2 weeks; she reported her first baby had a high palate and he could never latched on. However, this baby is different and she has been able to latch at L&D. Mom was on Buspar for anxiety during the pregnancy, an L3.  Mom reported (+) breast changes during the pregnancy. LC showed mom how to hand express and she was able to get several droplets of colostrum out of her left breast. Noticed that mom's nipples are short shafted but her tissue is compressible. LC finger fed baby some colostrum droplets but baby was spitty, it took her some time to start sucking.   Once baby started sucking consistently, LC transitioned to the breast, mom request the football hold, she didn't want to put too much pressure on her incision, she had a C/S due to baby's birth weight. Baby would slip on and off from mom's nipple but she was able to relatch each time. A few audible swallows noted during this 10 minutes feeding, reviewed key points for a deep latch.   LC provided education on normal newborn behavior, cluster feeding, feeding cues, size of baby's stomach and lactogenesis II.  Feeding plan:  1. Encouraged mom to feed baby STS 8-12 times/24 hours or sooner if feeding cues are present 2. Hand expression and finger feeding were also encouraged  BF brochure, BF resources and feeding diary were reviewed. FOB present and supportive. Parents reported all questions and concerns were answered, they're both aware of LC OP services and will call PRN.   Maternal Data Has patient been taught Hand Expression?: Yes Does the patient have breastfeeding experience  prior to this delivery?: Yes How long did the patient breastfeed?: 2 weeks  Feeding Mother's Current Feeding Choice: Breast Milk  LATCH Score Latch: Repeated attempts needed to sustain latch, nipple held in mouth throughout feeding, stimulation needed to elicit sucking reflex.  Audible Swallowing: A few with stimulation  Type of Nipple: Everted at rest and after stimulation (short shafted)  Comfort (Breast/Nipple): Soft / non-tender  Hold (Positioning): Assistance needed to correctly position infant at breast and maintain latch.  LATCH Score: 7   Lactation Tools Discussed/Used    Interventions Interventions: Breast feeding basics reviewed;Assisted with latch;Skin to skin;Breast massage;Hand express;Breast compression;Adjust position;Support pillows  Discharge Pump: Personal (Ameda DEBP at home) [facility] Program: No  Consult Status Consult Status: Follow-up Date: 10/15/20 Follow-up type: In-patient    Aneesha Holloran Venetia Morris 10/14/2020, 7:40 PM

## 2020-10-14 NOTE — Progress Notes (Signed)
Abdominal binder applied by [facility] CNM

## 2020-10-14 NOTE — Progress Notes (Signed)
Pt came in for a scheduled c-section due to transverse lie.  Upon admission into the perioperative area, pt was given a bedside ultrasound to confirm presentation.  Fetus was found to be in vertex position so patient was transferred from perioperative area to labor and delivery for induction.  OR schedule was not updated to reflect cancelled c-section.  A few hours later, patient came back emergently for a code cesarean due to cord prolapse.  OR schedule reflects a delay in scheduled case but in fact, it was cancelled and then rescheduled.

## 2020-10-14 NOTE — H&P (Addendum)
Megan Morris is a 25 y.o. female, G2P1001, IUP at 68 weeks, presenting for IOL for term with h/o macrosomia. EFW 8.15lbs 96% on 10/09/2020, AFI 19. H/O LGA, macrosomia with first pregnancy. H/O anxiety on Buspar 10mg  BID PRN, last dose was 2 weeks ago, depression no meds, currently was on lexapro  And stopped early pregnancy. Pt endorse + Fm. Denies vaginal leakage. Denies vaginal bleeding. Feels occational cxt.   Patient Active Problem List   Diagnosis Date Noted   Transverse lie of fetus 10/14/2020   Encounter for induction of labor 10/14/2020   Normal labor 02/18/2018   Obesity affecting pregnancy in third trimester 02/18/2018     Medications Prior to Admission  Medication Sig Dispense Refill Last Dose   acetaminophen (TYLENOL) 500 MG tablet Take 500 mg by mouth every 6 (six) hours as needed for moderate pain.      busPIRone (BUSPAR) 10 MG tablet Take 10 mg by mouth daily as needed (Anxiety).       Past Medical History:  Diagnosis Date   Anxiety    Depressive disorder    Morbid obesity with BMI of 40.0-44.9, adult (HCC)      No current facility-administered medications on file prior to encounter.   Current Outpatient Medications on File Prior to Encounter  Medication Sig Dispense Refill   acetaminophen (TYLENOL) 500 MG tablet Take 500 mg by mouth every 6 (six) hours as needed for moderate pain.     busPIRone (BUSPAR) 10 MG tablet Take 10 mg by mouth daily as needed (Anxiety).       Allergies  Allergen Reactions   Hydrocodone Itching    History of present pregnancy: Pt Info/Preference:  Screening/Consents:  Labs:   EDD: Estimated Date of Delivery: 10/21/20  Establised: Patient's last menstrual period was 01/15/2020.  Anatomy Scan: Date: 06/18/2021 Placenta Location: posterior Genetic Screen: Panoroma:LR-Female AFP: Declined First Tri: Quad: CF: Negative  Office: [facility]            Md: Dr 08/18/2021 First PNV: 12.6 wg Blood Type --/--/A POS (02/04 1054)  Language:  engllish Last PNV: 38.3 wg Rhogam    Flu Vaccine:  UTD   Antibody NEG (02/04 1054)  TDaP vaccine UTD   GTT: Early: 5.3 Third Trimester: 122  Feeding Plan: Breast BTL: No Rubella: Immune (07/19 0000)  Contraception: ??? VBAC: No RPR: NON REACTIVE (02/04 1051)   Circumcision: N/A   HBsAg: Negative (07/19 0000)  Pediatrician:  ???   HIV: Non-reactive (07/19 0000)   Prenatal Classes: no Additional 09-08-1989: EFW 8.15lns 96% on 10/09/2020, AFI 19 GBS: Negative/-- (01/22 0000)(For PCN allergy, check sensitivities)       Chlamydia: neg    MFM Referral/Consult:  GC: neg  Support Person: husband   PAP: ???  Pain Management: spinal Neonatologist Referral:  Hgb Electrophoresis:  AA  Birth Plan: PCS if malpresentation, and IOL if vxt.    Hgb NOB: 12.7    28W: 11.2   OB History    Gravida  2   Para  1   Term  1   Preterm      AB      Living  1     SAB      IAB      Ectopic      Multiple  0   Live Births  1          Past Medical History:  Diagnosis Date   Anxiety    Depressive disorder    Morbid  obesity with BMI of 40.0-44.9, adult [facility])    Past Surgical History:  Procedure Laterality Date   ANAL FISTULOTOMY  2021   TONSILLECTOMY     Family History: family history includes Diabetes in her father and mother; Fibromyalgia in her mother. Social History:  reports that she has never smoked. She has never used smokeless tobacco. She reports that she does not drink alcohol and does not use drugs.   Prenatal Transfer Tool  Maternal Diabetes: No Genetic Screening: Normal Maternal Ultrasounds/Referrals: Normal Fetal Ultrasounds or other Referrals:  None Maternal Substance Abuse:  No Significant Maternal Medications:  None Significant Maternal Lab Results: Group B Strep negative  ROS:  Review of Systems  Constitutional: Negative.   HENT: Negative.   Eyes: Negative.   Respiratory: Negative.   Cardiovascular: Negative.   Gastrointestinal: Negative.   Genitourinary:  Negative.   Musculoskeletal: Negative.   Skin: Negative.   Neurological: Negative.   Endo/Heme/Allergies: Negative.   Psychiatric/Behavioral: Negative.   All other systems reviewed and are negative.    Physical Exam: BP 124/72 (BP Location: Left Arm)    Temp 98.3 F (36.8 C) (Oral)    Resp 18    LMP 01/15/2020    SpO2 98%   Physical Exam Vitals and nursing note reviewed. Exam conducted with a chaperone present.  Constitutional:      Appearance: Normal appearance.  HENT:     Head: Normocephalic and atraumatic.     Nose: Nose normal.     Mouth/Throat:     Mouth: Mucous membranes are moist.  Eyes:     Conjunctiva/sclera: Conjunctivae normal.     Pupils: Pupils are equal, round, and reactive to light.  Cardiovascular:     Rate and Rhythm: Normal rate and regular rhythm.     Pulses: Normal pulses.     Heart sounds: Normal heart sounds.  Pulmonary:     Effort: Pulmonary effort is normal.     Breath sounds: Normal breath sounds.  Abdominal:     Palpations: Abdomen is soft.  Genitourinary:    Comments: Uterus gravida equal to dates, pelvis adequate for vaginal delivery.  Musculoskeletal:        General: Normal range of motion.     Cervical back: Normal range of motion and neck supple.  Skin:    General: Skin is warm.     Capillary Refill: Capillary refill takes less than 2 seconds.  Neurological:     General: No focal deficit present.     Mental Status: She is alert.  Psychiatric:        Mood and Affect: Mood normal.      NST: Baseline 145, moderate variability, +acels, -decels, NST reactive, Cat 1 UC:   none SVE:   Dilation: 2 Effacement (%): 40 Station: Ballotable Exam by:: J [employer], vertex verified by fetal sutures.  Leopold's: Position vertex, EFW 9.5lbs via leopold's.  Pelvis proven 9.9lbs Bedside US vertex.  Double bubble placed with ease, 60 ml NS instill in vaginal and uterine balloon.   Labs: No results found for this or any previous visit (from the  past 24 hour(s)).  Imaging:  No results found.  MAU Course: Orders Placed This Encounter  Procedures   Diet clear liquid Room service appropriate? Yes; Fluid consistency: Thin   Pre-admission testing diagnosis   May use local infiltration of 1% lidocaine plain to produce a skin wheal prior to IV insertion   Notify in-house Anesthesia team of nausea and vomiting greater than 5  hours   Assess for signs/symptoms of PIH/preeclampsia   RN to place order for: CBC if one has not been drawn in the past 6 hours for all patients with hypertensive disease, pre-eclampsia, eclampsia, thrombocytopenia or previous PLTC<150,000.   Identify to Anesthesia if patient plans to have postpartum tubal ligation; do not remove epidural without discussion with Anesthesiologist   Vital signs following Epidural Placement, re-bolus or re-dose monitor patient's BP and oxygen saturation every 5 minutes for 30 minutes   RN to remain at bedside continuously for 30 minutes post epidural placement, post re-bolus / re-dose   Notify Anesthesia if the patient becomes short of breath or complains of heaviness in chest, chest pain, and/or unrelieved pain   Notify Anesthesia prior to discontinuing epidural infusion   Vitals signs per unit policy   Notify Physician   Fetal monitoring per unit policy   Activity as tolerated   Cervical Exam   Measure blood pressure post delivery every 15 min x 1 hour then every 30 min x 1 hour   Fundal check post delivery every 15 min x 1 hour then every 30 min x 1 hour   If Rapid HIV test positive or known HIV positive: initiate AZT orders   May in and out cath x 2 for inability to void   Insert foley catheter   Discontinue foley prior to vaginal delivery   Initiate Carrier Fluid Protocol   Initiate Oral Care Protocol   Order Rapid HIV per protocol if no results on chart   Patient may have epidural placement upon request   Evaluate fetal heart rate to establish  reassuring pattern prior to initiating Cytotec or Pitocin   Perform a cervical exam prior to initiating Cytotec or Pitocin   Discontinue Pitocin if tachysystole with non-reassuring FHR is present   Nofify MD/CNM if tachysystole with non-reassuring FHR is present   Initiate intrauterine resuscitation if tachysystole with non-reasuring FHR is present   If tachysystole WITH reassuring FHR present notify MD / CNM   May administer Terbutaline 0.25 mg SQ x 1 dose if tachysystole with non-reassuring FHR is presesnt   Labor Induction   Full code   Insert and maintain IV Line   Admit to Inpatient (patient's expected length of stay will be greater than 2 midnights or inpatient only procedure)   Admit to Inpatient (patient's expected length of stay will be greater than 2 midnights or inpatient only procedure)   Meds ordered this encounter  Medications   DISCONTD: povidone-iodine 10 % swab 2 application   DISCONTD: lactated ringers infusion   DISCONTD: ceFAZolin (ANCEF) IVPB 2g/100 mL premix    Order Specific Question:   Indication:    Answer:   Surgical Prophylaxis   DISCONTD: ceFAZolin (ANCEF) 3 g in dextrose 5 % 50 mL IVPB    Order Specific Question:   Indication:    Answer:   Surgical Prophylaxis   ePHEDrine injection 10 mg   PHENYLephrine 40 mcg/ml in normal saline Adult IV Push Syringe (For Blood Pressure Support)   lactated ringers infusion 500 mL   fentaNYL 2 mcg/mL w/ bupivacaine 0.125% in NS 250 mL epidural infusion ([facility])   diphenhydrAMINE (BENADRYL) injection 12.5 mg   ePHEDrine injection 10 mg   PHENYLephrine 40 mcg/ml in normal saline Adult IV Push Syringe (For Blood Pressure Support)   lactated ringers infusion   oxytocin (PITOCIN) IV BOLUS FROM BAG   oxytocin (PITOCIN) IV infusion 30 units in NS 500 mL - Premix  lactated ringers infusion 500-1,000 mL   acetaminophen (TYLENOL) tablet 650 mg   ondansetron (ZOFRAN) injection 4 mg   sodium  citrate-citric acid (ORACIT) solution 30 mL   lidocaine (PF) (XYLOCAINE) 1 % injection 30 mL   fentaNYL (SUBLIMAZE) injection 50-100 mcg   hydrOXYzine (ATARAX/VISTARIL) tablet 50 mg   terbutaline (BRETHINE) injection 0.25 mg   misoprostol (CYTOTEC) tablet 25 mcg   oxytocin (PITOCIN) IV infusion 30 units in NS 500 mL - Premix    Order Specific Question:   Begin infusion at:    Answer:   2 milli-units/min (2 mL/hr)    Order Specific Question:   Increase infusion by:    Answer:   2 milli-units/min (2 mL/hr)    Assessment/Plan: Francessca Friis is a 25 y.o. female, G2P1001, IUP at 54 weeks, presenting for IOL for term with h/o macrosomia. EFW 8.15lbs 96% on 10/09/2020, AFI 19. H/O LGA, macrosomia with first pregnancy. H/O anxiety on Buspar 10mg  BID PRN, last dose was 2 weeks ago, depression no meds, currently was on lexapro  And stopped early pregnancy. Pt endorse + Fm. Denies vaginal leakage. Denies vaginal bleeding. Feels occational cxt. GBS-.  FWB: Cat 1 Fetal Tracing.   I discussed with patient risks, benefits and alternatives of labor induction including higher risk of cesarean delivery compared to spontaneous labor.  We discussed risks of induction agents including effects on fetal heart beat, contraction pattern and need for close monitoring.  Patient expressed understanding of all this and desired to proceed with the induction. Risks and benefits of induction were reviewed, including failure of method, prolonged labor, need for further intervention, risk of cesarean.  Patient and family verbalized understanding and denies any further questions at this time. Pt and family wish to proceed with induction process. Discussed induction options of Cytotec, foley bulb, AROM, and pitocin were reviewed as well as risks and benefits with use of each discussed.  Plan: Admit to Birthing Suite per consult with DR Anxiety/depression: Atarax PRN for anxiety, will discussed restarting lexapro PP  to prevent PPD. IOL: Sallye Ober cath now in place, anticipate out in 4 hours with low dose pitocin to max of 10, then anticipate AROM with increase in pitocin if fetal head engaged.  Routine [facility] orders Pain med/epidural prn Anticipate labor progression Anticipate vaginal delivery at 5:57pm.    Adriana Simas NP-C, CNM, MSN 10/14/2020, 10:33 AM  MD Addendum:  I saw and examined patient at bedside and agree with above findings, assessment and plan as outlined above by [facility] NP-C, CNM, MSN.  Patient was scheduled for a Cesarean section for transverse presentation.  On bedside ultrasound today fetus appears cephalic presentation. Patient desires induction of labor for history of unstable fetal lie.  All her questions were answered and she expressed understanding of the process. My EFW by Leopold's is 9 lbs.  Dr. [facility].  10/14/2020.   12.13 pm.

## 2020-10-14 NOTE — Op Note (Addendum)
Patient: Megan Morris DOB: 27-Apr-1996 MRN:  [id number]  DATE OF SURGERY: 10/14/2020  PREOP DIAGNOSIS:  1. 25 y/o G2P1001 at [redacted] weeks EGA being induced for unstable fetal lie.  2. Stat primary cesarean section for umbilical cord prolapse.  3. BMI of 44.     POSTOP DIAGNOSIS: Same as above.  PROCEDURE: Stat primary low uterine segment transverse cesarean section via Pfannenstiel incision with vacuum assistance.  SURGEON: Dr.  Hoover Browns  ASSISTANT: Dale Ina, CNM  ANESTHESIA: Bolused epidural, Dr. Armond Hang, [place].    COMPLICATIONS: None  FINDINGS: Viable female infant in cephalic presentation, DOA, weight 9lb 13 oz, Apgar scores of 8 and 9. Normal uterus and fallopian tubes and ovaries bilaterally.    EBL:  363cc  IV FLUID:  3300 cc LR   URINE OUTPUT: 400 cc clear urine  INDICATIONS:  25 y/o P1 who originally presented for a primary cesarean section for fetal transverse lie, however upon presentation she was found to have cephalic presentation and desired induction of labor for history of unstable fetal lie.  Induction of labor progressed to 6 cm dilation and during the cervical exam at that time there was rupture of membranes and subsequent umbilical cord prolapse and head and fetal hand presentation.  She was quickly consented for a stat cesarean section by [facility] who was at bedside with patient during the exam.     PROCEDURE:  She was quickly brought to the taken to the operating room where her epidural anesthesia was found to be adequate. Betadine was splashed on her abdomen and drapes applied. She already had a foley catheter in place. She received IV Ancef preoperatively. A Pfannenstiel incision was made with the scalpel on the skin and  extended through the subcutaneous layer and the fascia was knicked.  The fascia was further separated from the rectus muscles bilaterally using Mayo scissors. Kochers were placed inferiorly and then superiorly to allow further  separation of fascia from the rectus muscles.  The peritoneal cavity was entered bluntly with the fingers. Bladder blade and Richardson retractors used for exposure. Uterus was incised with scalpel 2 cm above bladder reflection and extended bluntly and with bandage scissors bilaterally.  Attempt was made to deliver baby's head with fundal pressure but was not effected.  Kiwi vacuum applied, 1 pull, no pop off, pressure to green zone on device, delivery effected within 15 seconds from application time, pressure released after delivery.  Then the rest of the body was then delivered with abdominal pressure.  She delivered a viable female infant, apgar scores 8, 9 within 4 minutes from start of procedure.  The cord was clamped and cut after 1 minute. Cord blood was collected.  The uterus was not exteriorized.  The edges of the uterus was grasped with Ring forceps.  The placenta was delivered with gentle traction on the umbilical cord. The uterus was cleared of clots and debris with a lap.  The uterine incision was closed with #1 Vicryl in a running locked stitch. An imbricating layer of the same stitch was placed over the initial closure.  A small area that bled on the left side was contained with figure of 8 stitch.  Irrigation was applied and suctioned out. Arista powder was applied for enhanced hemostasis and excellent hemostasis was noted over the incision.  The muscles and peritoneum were then reapproximated using chromic suture.  Fascia was closed using 0 Vicryl in a running stitch. The subcutaneous layer was irrigated and suctioned out. Small perforators  were contained with the bovie.  The subcutaneous layer was closed using 1-0 plain in interrupted stitches. The skin was closed using 4-0 Monocryl by [facility]. Benzocaine and steri strips were applied.  Honeycomb was then applied. The patient was then cleaned and she was taken to the recovery room with her baby in stable conditions.   ATTENDING  ATTESTATION: I was present and scrubbed for the procedure and the CNM assistant was required due to the complexity of anatomy.  SPECIMEN: Placenta to pathology, umbilical cord blood to lab.   DISPOSITION: Mother and baby to PACU in stable conditions.   Dr. Sallye Ober.   10/14/2020. 1649.

## 2020-10-14 NOTE — Anesthesia Preprocedure Evaluation (Signed)
Anesthesia Evaluation  Patient identified by MRN, date of birth, ID band Patient awake    Reviewed: Allergy & Precautions, NPO status , Patient's Chart, lab work & pertinent test results  Airway Mallampati: II  TM Distance: >3 FB Neck ROM: Full    Dental no notable dental hx.    Pulmonary neg pulmonary ROS,    Pulmonary exam normal breath sounds clear to auscultation       Cardiovascular negative cardio ROS Normal cardiovascular exam Rhythm:Regular Rate:Normal     Neuro/Psych negative neurological ROS  negative psych ROS   GI/Hepatic negative GI ROS, Neg liver ROS,   Endo/Other  Morbid obesity (BMI 45)  Renal/GU negative Renal ROS  negative genitourinary   Musculoskeletal negative musculoskeletal ROS (+)   Abdominal   Peds  Hematology  (+) Blood dyscrasia (Hgb 9.8), anemia ,   Anesthesia Other Findings   Reproductive/Obstetrics (+) Pregnancy                             Anesthesia Physical Anesthesia Plan  ASA: III  Anesthesia Plan: Epidural   Post-op Pain Management:    Induction:   PONV Risk Score and Plan: Treatment may vary due to age or medical condition  Airway Management Planned: Natural Airway  Additional Equipment:   Intra-op Plan:   Post-operative Plan:   Informed Consent: I have reviewed the patients History and Physical, chart, labs and discussed the procedure including the risks, benefits and alternatives for the proposed anesthesia with the patient or authorized representative who has indicated his/her understanding and acceptance.       Plan Discussed with: Anesthesiologist  Anesthesia Plan Comments: (Patient identified. Risks, benefits, options discussed with patient including but not limited to bleeding, infection, nerve damage, paralysis, failed block, incomplete pain control, headache, blood pressure changes, nausea, vomiting, reactions to medication,  itching, and post partum back pain. Confirmed with bedside nurse the patient's most recent platelet count. Confirmed with the patient that they are not taking any anticoagulation, have any bleeding history or any family history of bleeding disorders. Patient expressed understanding and wishes to proceed. All questions were answered. )        Anesthesia Quick Evaluation

## 2020-10-14 NOTE — Transfer of Care (Signed)
Immediate Anesthesia Transfer of Care Note  Patient: Megan Morris  Procedure(s) Performed: CESAREAN SECTION (N/A )  Patient Location: PACU  Anesthesia Type:Epidural  Level of Consciousness: awake, alert  and oriented  Airway & Oxygen Therapy: Patient Spontanous Breathing  Post-op Assessment: Report given to RN and Post -op Vital signs reviewed and stable  Post vital signs: Reviewed and stable  Last Vitals:  Vitals Value Taken Time  BP    Temp    Pulse    Resp    SpO2      Last Pain:  Vitals:   10/14/20 1415  TempSrc:   PainSc: 0-No pain         Complications: No complications documented.

## 2020-10-14 NOTE — Anesthesia Preprocedure Evaluation (Deleted)
Anesthesia Evaluation  Patient identified by MRN, date of birth, ID band Patient awake    Reviewed: Allergy & Precautions, NPO status , Patient's Chart, lab work & pertinent test results  Airway Mallampati: II  TM Distance: >3 FB Neck ROM: Full    Dental no notable dental hx.    Pulmonary neg pulmonary ROS,    Pulmonary exam normal breath sounds clear to auscultation       Cardiovascular negative cardio ROS Normal cardiovascular exam Rhythm:Regular Rate:Normal     Neuro/Psych PSYCHIATRIC DISORDERS Anxiety Depression negative neurological ROS     GI/Hepatic negative GI ROS, Neg liver ROS,   Endo/Other  Morbid obesity (BMI 45)  Renal/GU negative Renal ROS  negative genitourinary   Musculoskeletal negative musculoskeletal ROS (+)   Abdominal   Peds  Hematology  (+) Blood dyscrasia (Hgb 9.8), anemia ,   Anesthesia Other Findings G2P1 for primary C/S for transverse lie  Reproductive/Obstetrics (+) Pregnancy                             Anesthesia Physical Anesthesia Plan  ASA: III  Anesthesia Plan: Spinal   Post-op Pain Management:    Induction:   PONV Risk Score and Plan: Treatment may vary due to age or medical condition  Airway Management Planned: Natural Airway  Additional Equipment:   Intra-op Plan:   Post-operative Plan:   Informed Consent: I have reviewed the patients History and Physical, chart, labs and discussed the procedure including the risks, benefits and alternatives for the proposed anesthesia with the patient or authorized representative who has indicated his/her understanding and acceptance.     Dental advisory given  Plan Discussed with: CRNA  Anesthesia Plan Comments:         Anesthesia Quick Evaluation

## 2020-10-15 ENCOUNTER — Encounter (HOSPITAL_COMMUNITY): Payer: Self-pay | Admitting: Obstetrics & Gynecology

## 2020-10-15 DIAGNOSIS — D509 Iron deficiency anemia, unspecified: Secondary | ICD-10-CM | POA: Diagnosis present

## 2020-10-15 LAB — CBC
HCT: 23.7 % — ABNORMAL LOW (ref 36.0–46.0)
Hemoglobin: 7.7 g/dL — ABNORMAL LOW (ref 12.0–15.0)
MCH: 24.8 pg — ABNORMAL LOW (ref 26.0–34.0)
MCHC: 32.5 g/dL (ref 30.0–36.0)
MCV: 76.2 fL — ABNORMAL LOW (ref 80.0–100.0)
Platelets: 190 10*3/uL (ref 150–400)
RBC: 3.11 MIL/uL — ABNORMAL LOW (ref 3.87–5.11)
RDW: 14.5 % (ref 11.5–15.5)
WBC: 11.3 10*3/uL — ABNORMAL HIGH (ref 4.0–10.5)
nRBC: 0 % (ref 0.0–0.2)

## 2020-10-15 LAB — CREATININE, SERUM
Creatinine, Ser: 0.54 mg/dL (ref 0.44–1.00)
GFR, Estimated: >60 mL/min (ref >60)

## 2020-10-15 NOTE — Social Work (Signed)
MOB was referred for history of depression/anxiety and Edinburgh of 9.   * Referral screened out by Clinical Social Worker because none of the following criteria appear to apply:  ~ History of anxiety/depression during this pregnancy, or of post-partum depression following prior delivery. ~ Diagnosis of anxiety and/or depression within last 3 years. OR * MOB's symptoms currently being treated with medication and/or therapy. MOB previously on Lexapro and currently has an active prescription for Buspar 10 mg PRN.   CSW received and acknowledges consult for EDPS of 9.  Consult screened out due to 9 on EDPS does not warrant a CSW consult.  MOB whom scores are greater than 9/yes to question 10 on Edinburgh Postpartum Depression Screen warrants a CSW consult.   Please contact the Clinical Social Worker if needs arise, by [facility] request, or if MOB scores greater than 9/yes to question 10 on [place] Postpartum Depression Screen.  Manfred Arch, LCSWA Clinical Social Work [employer] and [employer]  [phone]

## 2020-10-15 NOTE — Progress Notes (Signed)
Subjective: POD# 1 Information for the patient's newborn:  Azua, Girl Sabrie [[id number]]  female    Reports feeling good, minimal pain Feeding: breast Reports tolerating PO and denies N/V, foley removed, ambulating and urinating w/o difficulty  Pain controlled with PO meds Denies HA/SOB/dizziness  Flatus passing Vaginal bleeding is normal, no clots     Objective:  VS:  Vitals:   10/15/20 0055 10/15/20 0225 10/15/20 0527 10/15/20 0653  BP: (!) 92/42  (!) 98/44 (!) 101/32  Pulse: 74  71 78  Resp:      Temp: 98.2 F (36.8 C)  98.1 F (36.7 C) 98.2 F (36.8 C)  TempSrc: Oral  Oral Oral  SpO2: 97% 99% 97%     Intake/Output Summary (Last 24 hours) at 10/15/2020 1355 Last data filed at 10/15/2020 1300 Gross per 24 hour  Intake 4080 ml  Output 3191 ml  Net 889 ml     Recent Labs    10/15/20 0535  WBC 11.3*  HGB 7.7*  HCT 23.7*  PLT 190    Blood type: --/--/A POS (02/04 1054) Rubella: Immune (07/19 0000)    Physical Exam:  General: alert, cooperative and no distress CV: Regular rate and rhythm Resp: clear Abdomen: soft, nontender, normal bowel sounds Incision: clean, dry, intact and honeycomb dressing Perineum:  Uterine Fundus: firm, below umbilicus, nontender Lochia: appropriate Ext: +1 bilateral edema, non-pitting, neg for pain, tenderness, or cords   Assessment/Plan: 25 y.o.   POD# 1. [id number]                  Active Problems:   Transverse lie of fetus   Encounter for induction of labor   Status post cesarean section   Routine post-op PP care          Advance diet as tolerated Advised warm fluids and ambulation to improve GI motility Encourage rest when baby rests Breastfeeding support Anticipate D/C 10/16/20   Roma Schanz, MSN, CNM 10/15/2020, 1:55 PM

## 2020-10-15 NOTE — Lactation Note (Signed)
This note was copied from a baby's chart. Lactation Consultation Note  Patient Name: Megan Morris [id number]'X Date: 10/15/2020 Reason for consult: Follow-up assessment Age:26 hours  Mother is a P2, infant is 39 weeks and wt is 9-11. Mother reports that she is breastfeeding and bottle feeding. Mother ask for assistance in getting infant latched to the Rt breast. She reports that she is unable to get milk from the rt breast and that infant pops on and off.   Assist mother with latching infant . Use the tea-cup hold to latch infant. Infant sustained latch for 15 mins. Mother reviewed hand expression and observed large drops of colostrum several times. Infant was observed with frequent suckling and pattern of swallows. Infant has a recessed chin.    Mother reports that infant wants to eat all the time and that she just wants her fed . She doesn't have a problem with using formula to supplement her. Mother doesn't want to start pumping until she gets home. She has a hand pump at the bedside.   Mother to continue to cue base feed infant and feed at least 8-12 times or more in 24 hours and advised to allow for cluster feeding infant as needed.  Mother to continue to due STS. Mother is aware of available LC services at [facility], BFSG'S, OP Dept, and phone # for questions or concerns about breastfeeding.  Mother receptive to all teaching and plan of care.     Maternal Data    Feeding Mother's Current Feeding Choice: Breast Milk  LATCH Score Latch: Grasps breast easily, tongue down, lips flanged, rhythmical sucking.  Audible Swallowing: Spontaneous and intermittent  Type of Nipple: Everted at rest and after stimulation  Comfort (Breast/Nipple): Soft / non-tender  Hold (Positioning): Assistance needed to correctly position infant at breast and maintain latch.  LATCH Score: 9   Lactation Tools Discussed/Used    Interventions Interventions: Breast feeding basics reviewed;Assisted with  latch;Skin to skin;Hand express;Breast compression;Adjust position;Support pillows;Position options;Expressed milk;Hand pump;Education  Discharge    Consult Status Consult Status: Follow-up Date: 10/15/20 Follow-up type: In-patient    Stevan Born [facility] 10/15/2020, 11:54 AM

## 2020-10-15 NOTE — Progress Notes (Signed)
In and out catheterized for 375 cc clear yellow urine because of inability to void. Patient tolerated procedure well.

## 2020-10-15 NOTE — Progress Notes (Signed)
Patient stated she tested positive for COVID19 on 08-31-2021 and provided documentation to me of same. Therefore, she falls within the 90 day window of time for contraindication for a repeat test.

## 2020-10-15 NOTE — Anesthesia Postprocedure Evaluation (Signed)
Anesthesia Post Note  Patient: Megan Morris  Procedure(s) Performed: CESAREAN SECTION (N/A )     Patient location during evaluation: PACU Anesthesia Type: Epidural Level of consciousness: oriented and awake and alert Pain management: pain level controlled Vital Signs Assessment: post-procedure vital signs reviewed and stable Respiratory status: spontaneous breathing, respiratory function stable and patient connected to nasal cannula oxygen Cardiovascular status: blood pressure returned to baseline and stable Postop Assessment: no headache, no backache, no apparent nausea or vomiting and epidural receding Anesthetic complications: no   No complications documented.  Last Vitals:  Vitals:   10/14/20 2136 10/15/20 0055  BP: (!) 102/58 (!) 92/42  Pulse: 79 74  Resp: 16   Temp: 37.1 C 36.8 C  SpO2: 97% 97%    Last Pain:  Vitals:   10/15/20 0055  TempSrc: Oral  PainSc: 0-No pain   Pain Goal: Patients Stated Pain Goal: 3 (10/14/20 2136)                 Megan Morris Megan Morris

## 2020-10-16 LAB — SURGICAL PATHOLOGY

## 2020-10-16 MED ORDER — ACETAMINOPHEN 500 MG PO TABS: 1000.0000 mg | ORAL_TABLET | Freq: Four times a day (QID) | ORAL | 0 refills | Status: DC

## 2020-10-16 MED ORDER — ESCITALOPRAM OXALATE 10 MG PO TABS: 10.0000 mg | ORAL_TABLET | Freq: Every day | ORAL | 3 refills | Status: AC

## 2020-10-16 MED ORDER — OXYCODONE HCL 5 MG PO TABS: 5.0000 mg | ORAL_TABLET | ORAL | 0 refills | Status: AC | PRN

## 2020-10-16 MED ORDER — IBUPROFEN 800 MG PO TABS: 800.0000 mg | ORAL_TABLET | Freq: Three times a day (TID) | ORAL | 0 refills | Status: AC

## 2020-10-16 MED ORDER — OXYCODONE HCL 5 MG PO TABS: 5.0000 mg | ORAL_TABLET | ORAL | 0 refills | Status: DC | PRN

## 2020-10-16 MED ORDER — POLYSACCHARIDE IRON COMPLEX 150 MG PO CAPS: 150.0000 mg | ORAL_CAPSULE | Freq: Every day | ORAL | 1 refills | Status: AC

## 2020-10-16 MED ORDER — POLYSACCHARIDE IRON COMPLEX 150 MG PO CAPS: 150.0000 mg | ORAL_CAPSULE | Freq: Every day | ORAL | 1 refills | Status: DC

## 2020-10-16 MED ORDER — ESCITALOPRAM OXALATE 10 MG PO TABS
10.0000 mg | ORAL_TABLET | Freq: Every day | ORAL | Status: DC
  Administered 2020-10-16: 10 mg via ORAL
  Filled 2020-10-16: qty 1

## 2020-10-16 MED ORDER — BUSPIRONE HCL 10 MG PO TABS: 10.0000 mg | ORAL_TABLET | Freq: Every day | ORAL | 3 refills | Status: AC | PRN

## 2020-10-16 MED ORDER — ESCITALOPRAM OXALATE 10 MG PO TABS: 10.0000 mg | ORAL_TABLET | Freq: Every day | ORAL | 3 refills | Status: DC

## 2020-10-16 MED ORDER — IBUPROFEN 800 MG PO TABS: 800.0000 mg | ORAL_TABLET | Freq: Three times a day (TID) | ORAL | 0 refills | Status: DC

## 2020-10-16 MED ORDER — BUSPIRONE HCL 10 MG PO TABS
10.0000 mg | ORAL_TABLET | Freq: Every day | ORAL | Status: DC | PRN
  Filled 2020-10-16: qty 1

## 2020-10-16 NOTE — Discharge Summary (Signed)
Primary Cesarean Section OB Discharge Summary  Patient Name: Megan Morris DOB: [birth year] MRN: [id number]  Date of admission: 10/14/2020 Intrauterine pregnancy: [redacted]w[redacted]d   Admitting diagnosis: Transverse lie of fetus [O32.2XX0] Encounter for induction of labor [Z34.90] Status post cesarean section [Z98.891] Secondary diagnosis: Anemia  Date of discharge: 10/16/2020    Discharge diagnosis: Term Pregnancy Delivered     Prenatal history: G2P2002   EDC : 10/21/2020, by Last Menstrual Period  P[facility] care at [facility]  Primary provider : [facility] Prenatal course complicated by Prolapsed cord, primary cesarean section delivery  Prenatal Labs: ABO, Rh: --/--/A POS (02/04 1054) /  Antibody: NEG (02/04 1054) Rubella: Immune (07/19 0000)  /  RPR: NON REACTIVE (02/04 1051)  HBsAg: Negative (07/19 0000)  HIV: Non-reactive (07/19 0000)  GBS: Negative/-- (01/22 0000)                                    Hospital course:  Onset of Labor With Unplanned C/S   25 y.o. yo [id number] at [redacted]w[redacted]d was admitted for scheduled cesarean for breech, was found vertex and opted for IOL on 10/14/2020. Patient had a labor course significant for prolapsed cord and emergency cesarean.  Delivery details as follows: Membrane Rupture Time/Date: 2:24 PM ,10/14/2020   Delivery Method:C-Section, Low Transverse  Details of operation can be found in separate operative note. Patient had a complicated postpartum course including numbness in right thigh (anesthesia and neurology consult complete), anemia (on iron) and anxiety and depression (started lexapro and buspar).  She is ambulating,tolerating a regular diet, passing flatus, and urinating well. Pt requesting discharge. Patient is discharged home in stable condition 10/16/20.  Newborn Data: Birth date:10/14/2020  Birth time:2:36 PM  Gender:Female  Living status:Living  Apgars:8 ,9  Weight:4420 g  Delivering PROVIDER: Sallye Ober, EMA                                                             Complications: prolapsed cord with emergency cesarean  Newborn Data: Live born female  Birth Weight: 9 lb 11.9 oz (4420 g) APGAR: 8, 9  Newborn Delivery   Birth date/time: 10/14/2020 14:36:00 Delivery type: C-Section, Low Transverse Trial of labor: Yes C-section categorization: Primary      Baby Feeding: Breast Disposition:home with mother  Post partum procedures:Anesthesia and neurology consult for numbness in right thigh,  Labs: Lab Results  Component Value Date   WBC 11.3 (H) 10/15/2020   HGB 7.7 (L) 10/15/2020   HCT 23.7 (L) 10/15/2020   MCV 76.2 (L) 10/15/2020   PLT 190 10/15/2020   CMP Latest Ref Rng & Units 10/15/2020  Glucose 70 - 99 mg/dL -  BUN 6 - 20 mg/dL -  Creatinine 6.28 - 3.15 mg/dL 1.76  Sodium 160 - 737 mmol/L -  Potassium 3.5 - 5.1 mmol/L -  Chloride 98 - 111 mmol/L -  CO2 22 - 32 mmol/L -  Calcium 8.9 - 10.3 mg/dL -  Total Protein 6.5 - 8.1 g/dL -  Total Bilirubin 0.3 - 1.2 mg/dL -  Alkaline Phos 38 - 106 U/L -  AST 15 - 41 U/L -  ALT 0 - 44 U/L -    Physical Exam @ time  of discharge:  Vitals:   10/15/20 0653 10/15/20 1418 10/15/20 2025 10/16/20 0658  BP: (!) 101/32 (!) 90/48  121/76  Pulse: 78 88 89 88  Resp:  18 17 18   Temp: 98.2 F (36.8 C) 98.2 F (36.8 C) 98.2 F (36.8 C) 98.3 F (36.8 C)  TempSrc: Oral Oral Oral Oral  SpO2:  98% 97% 97%   general: alert, cooperative and no distress lochia: appropriate uterine fundus: firm perineum: intact incision: Dressing is clean, dry, and intact extremities: DVT Evaluation: No evidence of DVT seen on physical exam. Negative Homan's sign. No significant calf/ankle edema.  Discharge instructions:  "Baby and Me Booklet" and [facility] Booklet Discharge Medications:  Allergies as of 10/16/2020      Reactions   Hydrocodone Itching      Medication List    TAKE these medications   acetaminophen 500 MG tablet Commonly known as: TYLENOL Take 2 tablets (1,000 mg total) by mouth every 6  (six) hours. What changed:   how much to take  when to take this  reasons to take this   busPIRone 10 MG tablet Commonly known as: BUSPAR Take 10 mg by mouth daily as needed (Anxiety).   escitalopram 10 MG tablet Commonly known as: LEXAPRO Take 1 tablet (10 mg total) by mouth daily. Start taking on: October 17, 2020   ibuprofen 800 MG tablet Commonly known as: ADVIL Take 1 tablet (800 mg total) by mouth every 8 (eight) hours.   iron polysaccharides 150 MG capsule Commonly known as: NIFEREX Take 1 capsule (150 mg total) by mouth daily. Start taking on: October 17, 2020   oxyCODONE 5 MG immediate release tablet Commonly known as: Oxy IR/ROXICODONE Take 1-2 tablets (5-10 mg total) by mouth every 4 (four) hours as needed for moderate pain.      Diet: routine diet Activity: Advance as tolerated. Pelvic rest x 6 weeks.  Follow up:1 week for incision and mood check and 6 weeks for pp visit.  Dr October 19, 2020 aware of pt status and POC reviewed.  Signed: Normand Sloop MSN, CNM 10/16/2020, 10:38 AM

## 2020-10-16 NOTE — Progress Notes (Addendum)
S: Pt states her right thigh is numb w/ "electric shocks waves" pulsating in the region just below the hip. The same sensation occurred after her last birth and lasted for 2 months. Pt was referred to neurology but lost insurance coverage and did not follow-up. Numbness resumed this afternoon. RN notified anesthesia and was instructed to contact OB. Pt ambulates safely w/o assistance. Fall risk and precautions reviewed. Reports hx of depression and anxiety, stopped Lexapro 10 mg PO daily during pregnancy and request to resume med. Anxiety treated w/ Buspar 10 mg PO PRN and request to resume.    O: Blood pressure (!) 90/48, pulse 88, temperature 98.2 F (36.8 C), temperature source Oral, resp. rate 18, last menstrual period 01/15/2020, SpO2 98 %, unknown if currently breastfeeding.   Focused assessment: Const: AAO x3 Extremities: warm, equal pulses bilat, equal ROM, neg for edema, tenderness, and cords  A/P: POD #2    -stable Leg leg numbness    -Will discuss with AM team, expect referral to neurology Hx of depression and anxiety    -restart Lexapro 10 mg PO daily and Buspar 10 mg PO PRN daily  Rhea Pink, MSN, CNM 10/16/2020 1:15 AM

## 2020-10-16 NOTE — Lactation Note (Signed)
This note was copied from a baby's chart. Lactation Consultation Note  Patient Name: Megan Morris [id number] Date: 10/16/2020 Reason for consult: Follow-up assessment;Term;Infant weight loss;Other (Comment) (5 % weight loss. per mom has recently fed. see doc flow sheets. per mom nipples are alittle sore at the start of the feeding. LC reviewed steps for latching to prevent soreness and assessed breast tissue, no breakdown, areola edema. see below for edu.) Age:41 hours  See doc flow sheets under hand expressing comments for detailed instructions tp prevent soreness from increasing and education down below.  LC provided the [facility] brochure with resource numbers and BFSG .   Maternal Data Has patient been taught Hand Expression?: Yes (LC reviewed  during assessment.)  Feeding Mother's Current Feeding Choice: Breast Milk and Formula Nipple Type: Slow - flow  LATCH Score                    Lactation Tools Discussed/Used Tools: Shells;Pump;Flanges;Comfort gels Flange Size: 24;27 Breast pump type: Manual Pump Education: Milk Storage;Setup, frequency, and cleaning Reason for Pumping: PRN , or with steps of latching  Interventions    Discharge Discharge Education: Engorgement and breast care;Warning signs for feeding baby Pump: Personal;Manual  Consult Status Consult Status: Complete Date: 10/16/20    Kathrin Greathouse 10/16/2020, 12:16 PM

## 2020-10-16 NOTE — Anesthesia Postprocedure Evaluation (Signed)
Patient was seen in postpartum room at the request of her OB team for right thigh sensory changes. She describes numbness and "electrical shock" sensations to the anterior and lateral thigh that began shortly after she delivered via C/S on 10/15/19. The sensation of numbness to the same area actually began 2.5 years ago after her first delivery (NSVD with epidural). She was referred to neurology but did not make appointment. The "electrical shock" sensations are new in the past 2 days and occur intermittently. She has had no motor changes and has had no problems ambulating independently. She has no back pain, bowel or bladder incontinence, fevers, or chills. Vital signs are normal.   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 Contributing factors include pregnancy and obesity. She has no back pain or red flag symptoms to indicate concern for epidural hematoma or abscess. I communicated to the patient that it is exceedingly unlikely that her symptoms are related to her neuraxial procedure or anesthesia. I also told her that it is likely that her symptoms will improve over the next few weeks, but numbness that has been present for >2 years may not improve. I see no indication for urgent neurology consult as an inpatient and have encouraged the patient to followup as an outpatient with her PCP or neurology if she has no improvement or has new concerns. All patient's questions were answered to her satisfaction and she is eager to be discharged from the hospital today as planned.   Rollene Rotunda, MD, MPH Anesthesiology

## 2020-10-16 NOTE — Progress Notes (Addendum)
Spoke with Dr. Richardson Landry in regards to patient's complaint of right thigh tingling and numbness. Dr. Richardson Landry stated that the numbness and tingling was unlikely due to spinal anathesia.

## 2020-10-16 NOTE — Progress Notes (Signed)
Notified Rhea Pink, Midwife in regards to patient complaining of right thigh numbness and tingling. Lizbeth Bark Midwife stated that she would come and evaluate patient.

## 2020-10-16 NOTE — Progress Notes (Signed)
Honeycomb dressing changed per provider order, incision edges approximated with no signs or symptoms of infection.  New honeycomb dressing applied without difficulty, reviewed incision care with patient and to remove dressing in 3 days.  Pt acknowledged instructions.

## 2020-10-17 ENCOUNTER — Encounter (HOSPITAL_COMMUNITY): Payer: Self-pay | Admitting: Obstetrics & Gynecology

## 2020-10-18 ENCOUNTER — Encounter (HOSPITAL_COMMUNITY): Payer: Self-pay | Admitting: Obstetrics & Gynecology

## 2020-10-18 MED ORDER — FENTANYL-BUPIVACAINE-NACL 0.5-0.125-0.9 MG/250ML-% EP SOLN
EPIDURAL | Status: DC | PRN
  Administered 2020-10-14: 12 mL/h via EPIDURAL

## 2020-10-18 NOTE — Anesthesia Preprocedure Evaluation (Signed)
Anesthesia Evaluation  Patient identified by MRN, date of birth, ID band Patient awake    Reviewed: Allergy & Precautions, NPO status , Patient's Chart, lab work & pertinent test results  Airway Mallampati: II  TM Distance: >3 FB Neck ROM: Full    Dental no notable dental hx.    Pulmonary neg pulmonary ROS,    Pulmonary exam normal breath sounds clear to auscultation       Cardiovascular negative cardio ROS Normal cardiovascular exam Rhythm:Regular Rate:Normal     Neuro/Psych negative neurological ROS  negative psych ROS   GI/Hepatic negative GI ROS, Neg liver ROS,   Endo/Other  Morbid obesity (BMI 45)  Renal/GU negative Renal ROS  negative genitourinary   Musculoskeletal negative musculoskeletal ROS (+)   Abdominal   Peds  Hematology  (+) Blood dyscrasia (Hgb 9.8), anemia ,   Anesthesia Other Findings   Reproductive/Obstetrics (+) Pregnancy                             Anesthesia Physical Anesthesia Plan  ASA: III  Anesthesia Plan: Epidural   Post-op Pain Management:    Induction:   PONV Risk Score and Plan: Treatment may vary due to age or medical condition  Airway Management Planned: Natural Airway  Additional Equipment:   Intra-op Plan:   Post-operative Plan:   Informed Consent: I have reviewed the patients History and Physical, chart, labs and discussed the procedure including the risks, benefits and alternatives for the proposed anesthesia with the patient or authorized representative who has indicated his/her understanding and acceptance.       Plan Discussed with: Anesthesiologist  Anesthesia Plan Comments: (Patient identified. Risks, benefits, options discussed with patient including but not limited to bleeding, infection, nerve damage, paralysis, failed block, incomplete pain control, headache, blood pressure changes, nausea, vomiting, reactions to medication,  itching, and post partum back pain. Confirmed with bedside nurse the patient's most recent platelet count. Confirmed with the patient that they are not taking any anticoagulation, have any bleeding history or any family history of bleeding disorders. Patient expressed understanding and wishes to proceed. All questions were answered. )        Anesthesia Quick Evaluation  

## 2020-10-18 NOTE — Anesthesia Procedure Notes (Signed)
Epidural Patient location during procedure: OB Start time: 10/14/2020 12:50 PM End time: 10/14/2020 1:00 PM  Staffing Anesthesiologist: Elmer Picker, MD Performed: anesthesiologist   Preanesthetic Checklist Completed: patient identified, IV checked, risks and benefits discussed, monitors and equipment checked, pre-op evaluation and timeout performed  Epidural Patient position: sitting Prep: DuraPrep and site prepped and draped Patient monitoring: continuous pulse ox, blood pressure, heart rate and cardiac monitor Approach: midline Location: L3-L4 Injection technique: LOR air  Needle:  Needle type: Tuohy  Needle gauge: 17 G Needle length: 9 cm Needle insertion depth: 7 cm Catheter type: closed end flexible Catheter size: 19 Gauge Catheter at skin depth: 13 cm Test dose: negative  Assessment Sensory level: T8 Events: blood not aspirated, injection not painful, no injection resistance, no paresthesia and negative IV test  Additional Notes Patient identified. Risks/Benefits/Options discussed with patient including but not limited to bleeding, infection, nerve damage, paralysis, failed block, incomplete pain control, headache, blood pressure changes, nausea, vomiting, reactions to medication both or allergic, itching and postpartum back pain. Confirmed with bedside nurse the patient's most recent platelet count. Confirmed with patient that they are not currently taking any anticoagulation, have any bleeding history or any family history of bleeding disorders. Patient expressed understanding and wished to proceed. All questions were answered. Sterile technique was used throughout the entire procedure. Please see nursing notes for vital signs. Test dose was given through epidural catheter and negative prior to continuing to dose epidural or start infusion. Warning signs of high block given to the patient including shortness of breath, tingling/numbness in hands, complete motor block, or  any concerning symptoms with instructions to call for help. Patient was given instructions on fall risk and not to get out of bed. All questions and concerns addressed with instructions to call with any issues or inadequate analgesia.  Reason for block:procedure for pain

## 2020-12-19 ENCOUNTER — Encounter: Payer: Self-pay | Admitting: *Deleted

## 2020-12-25 ENCOUNTER — Encounter: Payer: Self-pay | Admitting: Diagnostic Neuroimaging

## 2020-12-25 ENCOUNTER — Ambulatory Visit (INDEPENDENT_AMBULATORY_CARE_PROVIDER_SITE_OTHER): Payer: BC Managed Care – PPO | Admitting: Diagnostic Neuroimaging

## 2020-12-25 VITALS — BP 93/53 | HR 68 | Ht 68.0 in | Wt 263.2 lb

## 2020-12-25 DIAGNOSIS — G5711 Meralgia paresthetica, right lower limb: Secondary | ICD-10-CM

## 2020-12-25 NOTE — Patient Instructions (Signed)
-   meralgia paresthetica exercises reviewed - symptoms should improve over time

## 2020-12-25 NOTE — Progress Notes (Signed)
[facility]  PATIENT: Megan Morris DOB: 05-Apr-1996  REFERRING CLINICIAN: Hoover Browns, MD HISTORY FROM: patient  REASON FOR VISIT: new consult    HISTORICAL  CHIEF COMPLAINT:  Chief Complaint  Patient presents with  . Pain, numbness right thigh    Rm 6 New Pt  "symptoms since June 2019 right after son was born, went away then returned after having daughter in Feb 2022; pain/numbness are constant, like a shock feeling, worse when walking"    HISTORY OF PRESENT ILLNESS:   25 year old female here for evaluation of right thigh numbness.  In 2019 patient was pregnant and had delivery, and then felt postpartum numbness and pain on the right anterolateral thigh.  This lasted for about 1 month and then pain resolved.  She was left with mild residual numbness.  In 2022 patient was pregnant and delivered in February 2022, and again developed right thigh pain and numbness.  This time pain has continued for the past 2 months.  Walking and standing seems to aggravate symptoms.     REVIEW OF SYSTEMS: Full 14 system review of systems performed and negative with exception of: as per HPI.  ALLERGIES: Allergies  Allergen Reactions  . Hydrocodone Itching    hives    HOME MEDICATIONS: Outpatient Medications Prior to Visit  Medication Sig Dispense Refill  . busPIRone (BUSPAR) 10 MG tablet Take 1 tablet (10 mg total) by mouth daily as needed (Anxiety). 30 tablet 3  . escitalopram (LEXAPRO) 10 MG tablet Take 1 tablet (10 mg total) by mouth daily. 30 tablet 3  . MILI 0.25-35 MG-MCG tablet Take 1 tablet by mouth at bedtime.    Marland Kitchen ibuprofen (ADVIL) 800 MG tablet Take 1 tablet (800 mg total) by mouth every 8 (eight) hours. (Patient not taking: Reported on 12/25/2020) 30 tablet 0  . iron polysaccharides (NIFEREX) 150 MG capsule Take 1 capsule (150 mg total) by mouth daily. (Patient not taking: Reported on 12/25/2020) 30 capsule 1  . oxyCODONE (OXY IR/ROXICODONE) 5 MG immediate  release tablet Take 1-2 tablets (5-10 mg total) by mouth every 4 (four) hours as needed for moderate pain. (Patient not taking: Reported on 12/25/2020) 30 tablet 0  . acetaminophen (TYLENOL) 500 MG tablet Take 2 tablets (1,000 mg total) by mouth every 6 (six) hours. 30 tablet 0   No facility-administered medications prior to visit.    PAST MEDICAL HISTORY: Past Medical History:  Diagnosis Date  . Anxiety   . Depressive disorder   . Migraines    paient denies  . Morbid obesity with BMI of 40.0-44.9, adult (HCC)     PAST SURGICAL HISTORY: Past Surgical History:  Procedure Laterality Date  . ANAL FISTULOTOMY  2021  . CESAREAN SECTION N/A 10/14/2020   Procedure: CESAREAN SECTION;  Surgeon: Hoover Browns, MD;  Location: [facility] LD ORS;  Service: Obstetrics;  Laterality: N/A;  . TONSILLECTOMY      FAMILY HISTORY: Family History  Problem Relation Age of Onset  . Diabetes Mother   . Fibromyalgia Mother   . Diabetes Father     SOCIAL HISTORY: Social History   Socioeconomic History  . Marital status: Married    Spouse name: Lorriane Shire  . Number of children: 2  . Years of education: 71  . Highest education level: Not on file  Occupational History    Comment: cust service rep  Tobacco Use  . Smoking status: Never Smoker  . Smokeless tobacco: Never Used  Vaping Use  . Vaping Use: Never  used  Substance and Sexual Activity  . Alcohol use: Yes    Comment: occas  . Drug use: Never  . Sexual activity: Yes    Birth control/protection: Pill    Comment: Last IC 04/10/18  Other Topics Concern  . Not on file  Social History Narrative   Lives with husband, 2 children   Caffeine- heavy       Social Determinants of Health   Financial Resource Strain: Not on file  Food Insecurity: Not on file  Transportation Needs: Not on file  Physical Activity: Not on file  Stress: Not on file  Social Connections: Not on file  Intimate Partner Violence: Not on file     PHYSICAL EXAM  GENERAL  EXAM/CONSTITUTIONAL: Vitals:  Vitals:   12/25/20 1509  BP: (!) 93/53  Pulse: 68  Weight: 263 lb 3.2 oz (119.4 kg)  Height: 5\' 8"  (1.727 m)   Body mass index is 40.02 kg/m. Wt Readings from Last 3 Encounters:  12/25/20 263 lb 3.2 oz (119.4 kg)  10/10/20 285 lb (129.3 kg)  03/01/20 245 lb 6.4 oz (111.3 kg)    Patient is in no distress; well developed, nourished and groomed; neck is supple  CARDIOVASCULAR:  Examination of carotid arteries is normal; no carotid bruits  Regular rate and rhythm, no murmurs  Examination of peripheral vascular system by observation and palpation is normal  EYES:  Ophthalmoscopic exam of optic discs and posterior segments is normal; no papilledema or hemorrhages No exam data present  MUSCULOSKELETAL:  Gait, strength, tone, movements noted in Neurologic exam below  NEUROLOGIC: MENTAL STATUS:  No flowsheet data found.  awake, alert, oriented to person, place and time  recent and remote memory intact  normal attention and concentration  language fluent, comprehension intact, naming intact  fund of knowledge appropriate  CRANIAL NERVE:   2nd - no papilledema on fundoscopic exam  2nd, 3rd, 4th, 6th - pupils equal and reactive to light, visual fields full to confrontation, extraocular muscles intact, no nystagmus  5th - facial sensation symmetric  7th - facial strength symmetric  8th - hearing intact  9th - palate elevates symmetrically, uvula midline  11th - shoulder shrug symmetric  12th - tongue protrusion midline  MOTOR:   normal bulk and tone, full strength in the BUE, BLE  SENSORY:   normal and symmetric to light touch, temperature, vibration; EXCEPT DECR SENS IN RIGHT ANTEROLATERAL THIGH  COORDINATION:   finger-nose-finger, fine finger movements normal  REFLEXES:   deep tendon reflexes trace and symmetric  GAIT/STATION:   narrow based gait     DIAGNOSTIC DATA (LABS, IMAGING, TESTING) - I reviewed  patient records, labs, notes, testing and imaging myself where available.  Lab Results  Component Value Date   WBC 11.3 (H) 10/15/2020   HGB 7.7 (L) 10/15/2020   HCT 23.7 (L) 10/15/2020   MCV 76.2 (L) 10/15/2020   PLT 190 10/15/2020      Component Value Date/Time   NA 134 (L) 10/12/2020 1051   K 3.8 10/12/2020 1051   CL 104 10/12/2020 1051   CO2 21 (L) 10/12/2020 1051   GLUCOSE 90 10/12/2020 1051   BUN 7 10/12/2020 1051   CREATININE 0.54 10/15/2020 0535   CALCIUM 8.9 10/12/2020 1051   PROT 6.2 (L) 03/01/2020 1440   ALBUMIN 3.5 03/01/2020 1440   AST 14 (L) 03/01/2020 1440   ALT 12 03/01/2020 1440   ALKPHOS 53 03/01/2020 1440   BILITOT 0.2 (L) 03/01/2020 1440  GFRNONAA >60 10/15/2020 0535   GFRAA >60 03/01/2020 1440   No results found for: CHOL, HDL, LDLCALC, LDLDIRECT, TRIG, CHOLHDL No results found for: NWGN5A No results found for: VITAMINB12 No results found for: TSH      ASSESSMENT AND PLAN  25 y.o. year old female here with right anterolateral thigh numbness and pain, consistent with meralgia paresthetica.  Likely related to physical changes of pregnancy and delivery.  Advised to start gentle stretching exercises, pursue gradual weight loss, and symptoms should resolve over time.  Dx:  1. Meralgia paraesthetica, right       PLAN:  - meralgia paresthetica exercises reviewed - symptoms should improve over time  Return for return to PCP, pending if symptoms worsen or fail to improve.    Suanne Marker, MD 12/25/2020, 3:50 PM Certified in Neurology, Neurophysiology and Neuroimaging  [facility] [address], [address] [place], [place] [address] [phone]

## 2024-05-26 ENCOUNTER — Other Ambulatory Visit: Payer: Self-pay | Admitting: Obstetrics and Gynecology

## 2024-07-15 ENCOUNTER — Ambulatory Visit (HOSPITAL_COMMUNITY): Admit: 2024-07-15 | Admitting: Obstetrics and Gynecology

## 2024-07-15 SURGERY — SALPINGECTOMY, BILATERAL, LAPAROSCOPIC
Anesthesia: General | Laterality: Bilateral
